# Patient Record
Sex: Female | Born: 1935 | Race: White | Hispanic: No | Marital: Married | Smoking: Former smoker
Health system: Southern US, Community
[De-identification: ages and names within clinical notes are randomized; demographics above are authoritative.]

## PROBLEM LIST (undated history)

## (undated) DIAGNOSIS — G459 Transient cerebral ischemic attack, unspecified: Secondary | ICD-10-CM

## (undated) DIAGNOSIS — F0390 Unspecified dementia without behavioral disturbance: Secondary | ICD-10-CM

## (undated) DIAGNOSIS — R0602 Shortness of breath: Secondary | ICD-10-CM

## (undated) DIAGNOSIS — J449 Chronic obstructive pulmonary disease, unspecified: Secondary | ICD-10-CM

## (undated) DIAGNOSIS — K219 Gastro-esophageal reflux disease without esophagitis: Secondary | ICD-10-CM

## (undated) DIAGNOSIS — I4892 Unspecified atrial flutter: Secondary | ICD-10-CM

## (undated) DIAGNOSIS — I10 Essential (primary) hypertension: Secondary | ICD-10-CM

## (undated) DIAGNOSIS — M1990 Unspecified osteoarthritis, unspecified site: Secondary | ICD-10-CM

## (undated) HISTORY — DX: Unspecified dementia, unspecified severity, without behavioral disturbance, psychotic disturbance, mood disturbance, and anxiety: F03.90

## (undated) HISTORY — DX: Essential (primary) hypertension: I10

## (undated) HISTORY — DX: Gastro-esophageal reflux disease without esophagitis: K21.9

## (undated) HISTORY — DX: Unspecified atrial flutter: I48.92

---

## 1998-08-24 ENCOUNTER — Other Ambulatory Visit: Admission: RE | Admit: 1998-08-24 | Discharge: 1998-08-24 | Payer: Self-pay | Admitting: Obstetrics and Gynecology

## 1998-11-03 ENCOUNTER — Other Ambulatory Visit: Admission: RE | Admit: 1998-11-03 | Discharge: 1998-11-03 | Payer: Self-pay | Admitting: Family Medicine

## 1999-03-07 HISTORY — PX: RADIOFREQUENCY ABLATION: SHX2290

## 1999-03-15 ENCOUNTER — Encounter: Admission: RE | Admit: 1999-03-15 | Discharge: 1999-03-15 | Payer: Self-pay | Admitting: Family Medicine

## 1999-03-15 ENCOUNTER — Encounter: Payer: Self-pay | Admitting: Family Medicine

## 1999-03-31 ENCOUNTER — Encounter: Payer: Self-pay | Admitting: Family Medicine

## 1999-03-31 ENCOUNTER — Encounter: Admission: RE | Admit: 1999-03-31 | Discharge: 1999-03-31 | Payer: Self-pay | Admitting: Family Medicine

## 1999-11-19 ENCOUNTER — Inpatient Hospital Stay (HOSPITAL_COMMUNITY): Admission: EM | Admit: 1999-11-19 | Discharge: 1999-11-20 | Payer: Self-pay | Admitting: Podiatry

## 1999-11-19 ENCOUNTER — Encounter: Payer: Self-pay | Admitting: Emergency Medicine

## 1999-11-23 ENCOUNTER — Ambulatory Visit (HOSPITAL_COMMUNITY): Admission: RE | Admit: 1999-11-23 | Discharge: 1999-11-24 | Payer: Self-pay | Admitting: Internal Medicine

## 2001-10-14 ENCOUNTER — Other Ambulatory Visit: Admission: RE | Admit: 2001-10-14 | Discharge: 2001-10-14 | Payer: Self-pay | Admitting: Family Medicine

## 2002-11-04 ENCOUNTER — Encounter: Admission: RE | Admit: 2002-11-04 | Discharge: 2002-11-04 | Payer: Self-pay | Admitting: Family Medicine

## 2002-11-04 ENCOUNTER — Encounter: Payer: Self-pay | Admitting: Family Medicine

## 2003-11-12 ENCOUNTER — Encounter: Admission: RE | Admit: 2003-11-12 | Discharge: 2003-11-12 | Payer: Self-pay | Admitting: Family Medicine

## 2004-01-25 ENCOUNTER — Encounter: Admission: RE | Admit: 2004-01-25 | Discharge: 2004-03-10 | Payer: Self-pay | Admitting: Family Medicine

## 2004-12-30 ENCOUNTER — Encounter: Admission: RE | Admit: 2004-12-30 | Discharge: 2004-12-30 | Payer: Self-pay | Admitting: Family Medicine

## 2006-02-08 ENCOUNTER — Encounter: Admission: RE | Admit: 2006-02-08 | Discharge: 2006-02-08 | Payer: Self-pay | Admitting: Internal Medicine

## 2006-12-25 ENCOUNTER — Ambulatory Visit: Payer: Self-pay | Admitting: Internal Medicine

## 2007-01-02 ENCOUNTER — Ambulatory Visit: Payer: Self-pay

## 2007-01-14 ENCOUNTER — Emergency Department (HOSPITAL_COMMUNITY): Admission: EM | Admit: 2007-01-14 | Discharge: 2007-01-14 | Payer: Self-pay | Admitting: Emergency Medicine

## 2007-03-25 ENCOUNTER — Emergency Department (HOSPITAL_COMMUNITY): Admission: EM | Admit: 2007-03-25 | Discharge: 2007-03-25 | Payer: Self-pay | Admitting: Emergency Medicine

## 2007-05-17 ENCOUNTER — Emergency Department (HOSPITAL_COMMUNITY): Admission: EM | Admit: 2007-05-17 | Discharge: 2007-05-17 | Payer: Self-pay | Admitting: Emergency Medicine

## 2009-01-19 ENCOUNTER — Ambulatory Visit: Payer: Self-pay | Admitting: Internal Medicine

## 2009-01-19 ENCOUNTER — Emergency Department (HOSPITAL_COMMUNITY): Admission: EM | Admit: 2009-01-19 | Discharge: 2009-01-19 | Payer: Self-pay | Admitting: Emergency Medicine

## 2009-02-12 DIAGNOSIS — I4892 Unspecified atrial flutter: Secondary | ICD-10-CM

## 2009-02-12 DIAGNOSIS — I10 Essential (primary) hypertension: Secondary | ICD-10-CM

## 2009-02-16 ENCOUNTER — Ambulatory Visit: Payer: Self-pay | Admitting: Internal Medicine

## 2009-02-16 DIAGNOSIS — I4891 Unspecified atrial fibrillation: Secondary | ICD-10-CM | POA: Insufficient documentation

## 2009-03-09 IMAGING — CR DG CHEST 1V PORT
1 series · 1 of 1 positions shown · non-contrast
Comparison: None.

CLINICAL DATA: 76-year-old with chest pain. 
PORTABLE CHEST - 1 VIEW:

[view not recorded]
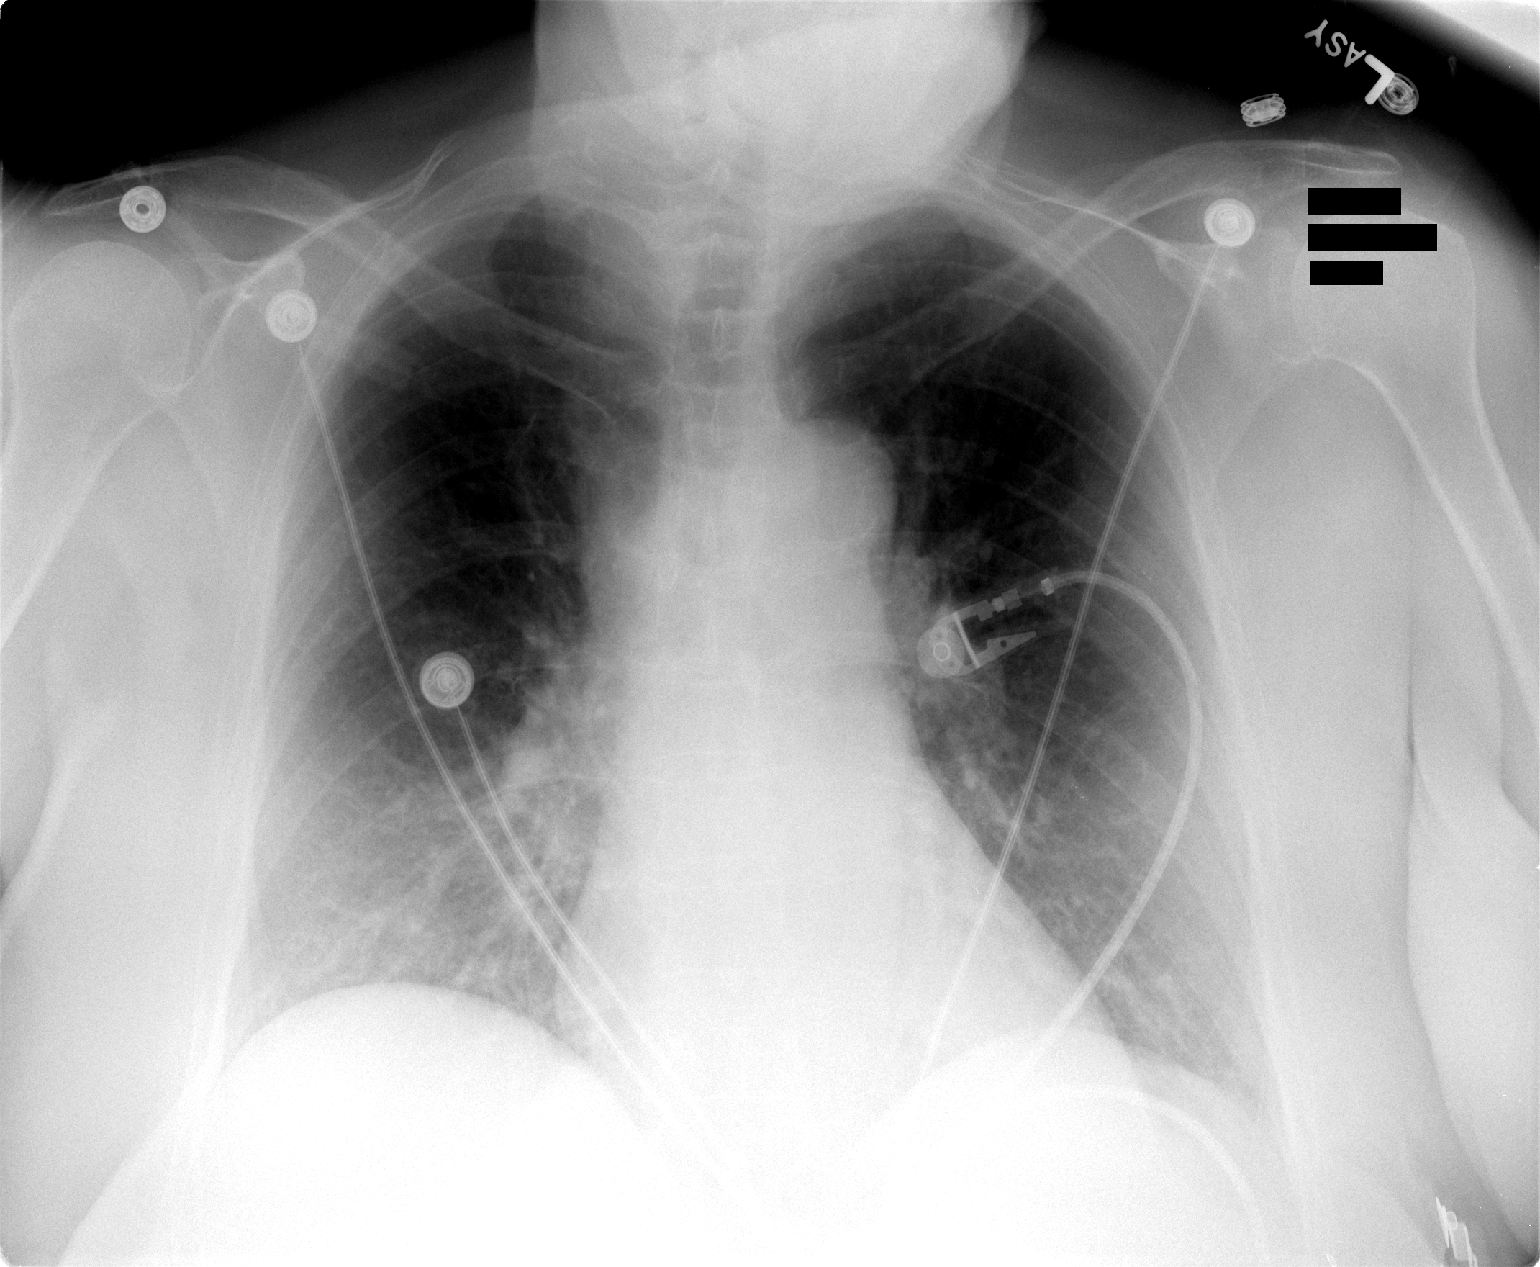

[1 of 1 positions shown; findings below may reference images not displayed]

FINDINGS: The cardiac silhouette, mediastinal, and hilar contours are within normal limits for the AP portable projection.  Right paratracheal density appears to be vascularity.  There is mild vascular crowding and streaky basilar atelectasis or scarring changes. No edema, infiltrates, or effusions.
IMPRESSION: No acute cardiopulmonary findings. Mild basilar vascular crowding and streaky atelectasis or scarring.

## 2009-04-13 ENCOUNTER — Emergency Department (HOSPITAL_COMMUNITY): Admission: EM | Admit: 2009-04-13 | Discharge: 2009-04-13 | Payer: Self-pay | Admitting: Emergency Medicine

## 2009-08-10 ENCOUNTER — Ambulatory Visit: Payer: Self-pay | Admitting: Internal Medicine

## 2009-09-16 ENCOUNTER — Emergency Department (HOSPITAL_BASED_OUTPATIENT_CLINIC_OR_DEPARTMENT_OTHER): Admission: EM | Admit: 2009-09-16 | Discharge: 2009-09-16 | Payer: Self-pay | Admitting: Emergency Medicine

## 2009-09-16 ENCOUNTER — Ambulatory Visit: Payer: Self-pay | Admitting: Diagnostic Radiology

## 2009-10-28 ENCOUNTER — Ambulatory Visit: Payer: Self-pay | Admitting: Internal Medicine

## 2010-04-05 NOTE — Assessment & Plan Note (Signed)
Summary: follow up new meds/mt   Visit Type:  Follow-up Primary Provider:  Leanord Hawking, MD   History of Present Illness: Hannah Bowers returns today for followup.  She is a pleasant 77 yo woman with a h/o dementia and atrial fibrillation.  She was started on amiodarone. With her dementia, she was not thought to be a good coumadin candidate.  Since starting amiodarone, she has not had any more symptomatic atrial fibrillation.  However she was hospitalized with a TIA several months ago.  She denies c/p or palpitations.  Her daughter states that she continues to be very pleasantly demented.  She fell one time, tripping on carpet.  Current Medications (verified): 1)  Amlodipine Besylate 5 Mg Tabs (Amlodipine Besylate) .... Take One Tablet By Mouth Daily 2)  Hydrochlorothiazide 25 Mg Tabs (Hydrochlorothiazide) .... Take One Tablet By Mouth Daily. 3)  Aspirin Ec 325 Mg Tbec (Aspirin) .... Take One Tablet By Mouth Daily 4)  Loratadine Allergy Relief 10 Mg Tbdp (Loratadine) .... Take One Tablet By Mouth Once Daily. 5)  Flonase 50 Mcg/act Susp (Fluticasone Propionate) .... Uad 6)  Galantamine Hydrobromide 24 Mg Xr24h-Cap (Galantamine Hydrobromide) .... Take One Tablet By Mouth Once Daily. 7)  Protonix 40 Mg Tbec (Pantoprazole Sodium) .... Take One Tablet By Mouth Once Daily. 8)  Alprazolam 0.5 Mg Tabs (Alprazolam) .Marland Kitchen.. 1 Tab Two Times A Day 9)  Amiodarone Hcl 200 Mg Tabs (Amiodarone Hcl) .... Take One Tablet By Mouth Once Daily 10)  Nitrostat 0.4 Mg Subl (Nitroglycerin) .Marland Kitchen.. 1 Tablet Under Tongue At Onset of Chest Pain; You May Repeat Every 5 Minutes For Up To 3 Doses. 11)  Mapap 325 Mg Tabs (Acetaminophen) .... Uad As Needed 12)  Potassium Chloride Cr 10 Meq Cr-Caps (Potassium Chloride) .... Take One Tablet By Mouth Daily 13)  Levothroid 25 Mcg Tabs (Levothyroxine Sodium) .... Take One Tablet By Mouth Once Daily. 14)  Pradaxa 150 Mg Caps (Dabigatran Etexilate Mesylate) .... Take 1 Capsule By Mouth Two Times A  Day  Allergies (verified): No Known Drug Allergies  Past History:  Past Medical History: Last updated: 02/16/2009 Current Problems:  HYPERTENSION (ICD-401.9) ATRIAL FLUTTER, PAROXYSMAL (ICD-427.32)    Past Surgical History: Last updated: 02/12/2009  radiofrequency catheter ablation 2001  Review of Systems  The patient denies chest pain, syncope, dyspnea on exertion, and peripheral edema.    Vital Signs:  Patient profile:   77 year old female Height:      66 inches Weight:      211 pounds Pulse rate:   61 / minute BP sitting:   138 / 80  (left arm)  Vitals Entered By: Laurance Flatten CMA (October 28, 2009 2:43 PM)  Physical Exam  General:  Elderly, well developed, well nourished, in no acute distress.  HEENT: normal Neck: supple. No JVD. Carotids 2+ bilaterally no bruits Cor: RRR no rubs, gallops or murmur Lungs: CTA Ab: soft, nontender. nondistended. No HSM. Good bowel sounds Ext: warm. no cyanosis, clubbing or edema Neuro: alert and oriented. Grossly nonfocal. affect pleasant    Impression & Recommendations:  Problem # 1:  ATRIAL FIBRILLATION (ICD-427.31) Her symptoms are well controlled. Continue meds as below. Her updated medication list for this problem includes:    Aspirin Ec 325 Mg Tbec (Aspirin) .Marland Kitchen... Take one tablet by mouth daily    Amiodarone Hcl 200 Mg Tabs (Amiodarone hcl) .Marland Kitchen... Take one tablet by mouth once daily  Problem # 2:  HYPERTENSION (ICD-401.9) Continue current meds. A low sodium diet is  recommended. Her updated medication list for this problem includes:    Amlodipine Besylate 5 Mg Tabs (Amlodipine besylate) .Marland Kitchen... Take one tablet by mouth daily    Hydrochlorothiazide 25 Mg Tabs (Hydrochlorothiazide) .Marland Kitchen... Take one tablet by mouth daily.    Aspirin Ec 325 Mg Tbec (Aspirin) .Marland Kitchen... Take one tablet by mouth daily  Patient Instructions: 1)  Your physician recommends that you schedule a follow-up appointment in: 6 months with Dr Ladona Ridgel

## 2010-04-05 NOTE — Assessment & Plan Note (Signed)
Summary: 6 MO F/U   Visit Type:  Follow-up Primary Provider:  Leanord Hawking, MD   History of Present Illness: Hannah Bowers returns today for followup.  She is a pleasant 77 yo woman with a h/o dementia and atrial fibrillation.  She was started on amiodarone. With her dementia, she was not thought to be a good coumadin candidate.  Since starting amiodarone, she has not had any more symptomatic atrial fibrillation.  However she was hospitalized with a TIA several months ago.  She denies c/p or palpitations.  Current Medications (verified): 1)  Amlodipine Besylate 5 Mg Tabs (Amlodipine Besylate) .... Take One Tablet By Mouth Daily 2)  Hydrochlorothiazide 25 Mg Tabs (Hydrochlorothiazide) .... Take One Tablet By Mouth Daily. 3)  Aspirin Ec 325 Mg Tbec (Aspirin) .... Take One Tablet By Mouth Daily 4)  Loratadine Allergy Relief 10 Mg Tbdp (Loratadine) .... Take One Tablet By Mouth Once Daily. 5)  Flonase 50 Mcg/act Susp (Fluticasone Propionate) .... Uad 6)  Galantamine Hydrobromide 24 Mg Xr24h-Cap (Galantamine Hydrobromide) .... Take One Tablet By Mouth Once Daily. 7)  Protonix 40 Mg Tbec (Pantoprazole Sodium) .... Take One Tablet By Mouth Once Daily. 8)  Alprazolam 0.5 Mg Tabs (Alprazolam) .Marland Kitchen.. 1 Tab Two Times A Day 9)  Amiodarone Hcl 200 Mg Tabs (Amiodarone Hcl) .... Take One Tablet By Mouth Once Daily 10)  Nitrostat 0.4 Mg Subl (Nitroglycerin) .Marland Kitchen.. 1 Tablet Under Tongue At Onset of Chest Pain; You May Repeat Every 5 Minutes For Up To 3 Doses. 11)  Mapap 325 Mg Tabs (Acetaminophen) .... Uad As Needed 12)  Potassium Chloride Cr 10 Meq Cr-Caps (Potassium Chloride) .... Take One Tablet By Mouth Daily 13)  Levothroid 25 Mcg Tabs (Levothyroxine Sodium) .... Take One Tablet By Mouth Once Daily.  Allergies (verified): No Known Drug Allergies  Past History:  Past Medical History: Last updated: 02/16/2009 Current Problems:  HYPERTENSION (ICD-401.9) ATRIAL FLUTTER, PAROXYSMAL (ICD-427.32)    Past  Surgical History: Last updated: 02/12/2009  radiofrequency catheter ablation 2001  Review of Systems  The patient denies chest pain, syncope, dyspnea on exertion, and peripheral edema.    Vital Signs:  Patient profile:   77 year old female Height:      66 inches Weight:      208 pounds BMI:     33.69 Pulse rate:   61 / minute BP sitting:   146 / 60  (left arm)  Vitals Entered By: Laurance Flatten CMA (August 10, 2009 3:20 PM)  Physical Exam  General:  Elderly, well developed, well nourished, in no acute distress.  HEENT: normal Neck: supple. No JVD. Carotids 2+ bilaterally no bruits Cor: RRR no rubs, gallops or murmur Lungs: CTA Ab: soft, nontender. nondistended. No HSM. Good bowel sounds Ext: warm. no cyanosis, clubbing or edema Neuro: alert and oriented. Grossly nonfocal. affect pleasant    EKG  Procedure date:  08/10/2009  Findings:      Normal sinus rhythm with rate of:  61. IRBB  Impression & Recommendations:  Problem # 1:  ATRIAL FIBRILLATION (ICD-427.31) With the patients TIA and other cardiac risk factors, she is at high risk for stroke.  While she has some dementia, she has not fallen and is able to take her meds fairly reliably.  I have recommended we start Pradaxa for thromboembolic prevention. Her updated medication list for this problem includes:    Aspirin Ec 325 Mg Tbec (Aspirin) .Marland Kitchen... Take one tablet by mouth daily    Amiodarone Hcl 200 Mg  Tabs (Amiodarone hcl) .Marland Kitchen... Take one tablet by mouth once daily  Problem # 2:  HYPERTENSION (ICD-401.9) She has reasonable control.  A low sodium diet is requested. Her updated medication list for this problem includes:    Amlodipine Besylate 5 Mg Tabs (Amlodipine besylate) .Marland Kitchen... Take one tablet by mouth daily    Hydrochlorothiazide 25 Mg Tabs (Hydrochlorothiazide) .Marland Kitchen... Take one tablet by mouth daily.    Aspirin Ec 325 Mg Tbec (Aspirin) .Marland Kitchen... Take one tablet by mouth daily  Patient Instructions: 1)  Your physician  has recommended you make the following change in your medication: Start Pradaxa 150mg  1 capsule twice daily.

## 2010-04-07 NOTE — Miscellaneous (Signed)
Summary: [facility] Office Visit Note   [facility] Office Visit Note   Imported By: Roderic Ovens 02/24/2010 16:04:01  _____________________________________________________________________  External Attachment:    Type:   Image     Comment:   External Document

## 2010-04-28 ENCOUNTER — Ambulatory Visit (INDEPENDENT_AMBULATORY_CARE_PROVIDER_SITE_OTHER): Payer: MEDICARE | Admitting: Internal Medicine

## 2010-04-28 ENCOUNTER — Encounter: Payer: Self-pay | Admitting: Internal Medicine

## 2010-04-28 DIAGNOSIS — I4891 Unspecified atrial fibrillation: Secondary | ICD-10-CM

## 2010-04-28 DIAGNOSIS — I10 Essential (primary) hypertension: Secondary | ICD-10-CM

## 2010-05-03 NOTE — Assessment & Plan Note (Signed)
Summary: follow up/klrov/sl   Primary Provider:  Leanord Hawking, MD   History of Present Illness: Hannah Bowers returns today for followup.  She is a pleasant 77 yo woman with a h/o dementia and atrial fibrillation.  She was started on amiodarone. With her dementia, she was not thought to be a good coumadin candidate.  Since starting amiodarone, she has not had any more symptomatic atrial fibrillation.  She has a remote TIA.   She denies c/p or palpitations.  Her daughter states that she continues to be very pleasantly demented.  She has not had any recent falls.  Medications Prior to Update: 1)  Amlodipine Besylate 5 Mg Tabs (Amlodipine Besylate) .... Take One Tablet By Mouth Daily 2)  Hydrochlorothiazide 25 Mg Tabs (Hydrochlorothiazide) .... Take One Tablet By Mouth Daily. 3)  Aspirin Ec 325 Mg Tbec (Aspirin) .... Take One Tablet By Mouth Daily 4)  Loratadine Allergy Relief 10 Mg Tbdp (Loratadine) .... Take One Tablet By Mouth Once Daily. 5)  Flonase 50 Mcg/act Susp (Fluticasone Propionate) .... Uad 6)  Galantamine Hydrobromide 24 Mg Xr24h-Cap (Galantamine Hydrobromide) .... Take One Tablet By Mouth Once Daily. 7)  Protonix 40 Mg Tbec (Pantoprazole Sodium) .... Take One Tablet By Mouth Once Daily. 8)  Alprazolam 0.5 Mg Tabs (Alprazolam) .Marland Kitchen.. 1 Tab Two Times A Day 9)  Amiodarone Hcl 200 Mg Tabs (Amiodarone Hcl) .... Take One Tablet By Mouth Once Daily 10)  Nitrostat 0.4 Mg Subl (Nitroglycerin) .Marland Kitchen.. 1 Tablet Under Tongue At Onset of Chest Pain; You May Repeat Every 5 Minutes For Up To 3 Doses. 11)  Mapap 325 Mg Tabs (Acetaminophen) .... Uad As Needed 12)  Potassium Chloride Cr 10 Meq Cr-Caps (Potassium Chloride) .... Take One Tablet By Mouth Daily 13)  Levothroid 25 Mcg Tabs (Levothyroxine Sodium) .... Take One Tablet By Mouth Once Daily. 14)  Pradaxa 150 Mg Caps (Dabigatran Etexilate Mesylate) .... Take 1 Capsule By Mouth Two Times A Day  Allergies (verified): No Known Drug Allergies  Past  History:  Past Medical History: Last updated: 02/16/2009 Current Problems:  HYPERTENSION (ICD-401.9) ATRIAL FLUTTER, PAROXYSMAL (ICD-427.32)    Past Surgical History: Last updated: 02/12/2009  radiofrequency catheter ablation 2001  Review of Systems  The patient denies chest pain, syncope, dyspnea on exertion, and peripheral edema.    Vital Signs:  Patient profile:   77 year old female Height:      66 inches Weight:      207 pounds BMI:     33.53 Pulse rate:   63 / minute BP sitting:   118 / 70  (left arm)  Vitals Entered By: Hannah Bowers CMA (April 28, 2010 4:16 PM)  Physical Exam  General:  Elderly, well developed, well nourished, in no acute distress.  HEENT: normal Neck: supple. No JVD. Carotids 2+ bilaterally no bruits Cor: RRR no rubs, gallops or murmur Lungs: CTA Ab: soft, nontender. nondistended. No HSM. Good bowel sounds Ext: warm. no cyanosis, clubbing or edema Neuro: alert and oriented. Grossly nonfocal. affect pleasant    Impression & Recommendations:  Problem # 1:  ATRIAL FIBRILLATION (ICD-427.31) She appears to be maintaining NSR on amiodarone. Continue her current meds and will hold on coumadin with a h/o falls and dementia. Her updated medication list for this problem includes:    Aspirin Ec 325 Mg Tbec (Aspirin) .Marland Kitchen... Take one tablet by mouth daily    Amiodarone Hcl 200 Mg Tabs (Amiodarone hcl) .Marland Kitchen... Take one tablet by mouth once daily  Problem #  2:  HYPERTENSION (ICD-401.9) Her blood pressure is well controlled. Continue current meds Her updated medication list for this problem includes:    Amlodipine Besylate 5 Mg Tabs (Amlodipine besylate) .Marland Kitchen... Take one tablet by mouth daily    Hydrochlorothiazide 25 Mg Tabs (Hydrochlorothiazide) .Marland Kitchen... Take one tablet by mouth daily.    Aspirin Ec 325 Mg Tbec (Aspirin) .Marland Kitchen... Take one tablet by mouth daily  Patient Instructions: 1)  Your physician wants you to follow-up in:  12 months with Dr Court Joy  will receive a reminder letter in the mail two months in advance. If you don't receive a letter, please call our office to schedule the follow-up appointment. 2)  Your physician recommends that you continue on your current medications as directed. Please refer to the Current Medication list given to you today.

## 2010-05-25 LAB — COMPREHENSIVE METABOLIC PANEL
AST: 53 U/L — ABNORMAL HIGH (ref 0–37)
Alkaline Phosphatase: 63 U/L (ref 39–117)
CO2: 26 mEq/L (ref 19–32)
Calcium: 8.9 mg/dL (ref 8.4–10.5)
Creatinine, Ser: 0.86 mg/dL (ref 0.4–1.2)
GFR calc non Af Amer: >60 mL/min (ref >60)
Glucose, Bld: 123 mg/dL — ABNORMAL HIGH (ref 70–99)
Total Protein: 7.6 g/dL (ref 6.0–8.3)

## 2010-05-25 LAB — URINALYSIS, ROUTINE W REFLEX MICROSCOPIC
Bilirubin Urine: NEGATIVE
Glucose, UA: NEGATIVE mg/dL
Nitrite: NEGATIVE
Specific Gravity, Urine: 1.016 (ref 1.005–1.030)
Urobilinogen, UA: 1 mg/dL (ref 0.0–1.0)
pH: 7.5 (ref 5.0–8.0)

## 2010-05-25 LAB — POCT CARDIAC MARKERS
CKMB, poc: 6.6 ng/mL (ref 1.0–8.0)
Myoglobin, poc: 155 ng/mL (ref 12–200)
Troponin i, poc: <0.05 ng/mL (ref 0.00–0.09)

## 2010-05-25 LAB — DIFFERENTIAL
Eosinophils Absolute: 0 10*3/uL (ref 0.0–0.7)
Lymphocytes Relative: 20 % (ref 12–46)
Monocytes Absolute: 0.5 10*3/uL (ref 0.1–1.0)
Monocytes Relative: 8 % (ref 3–12)

## 2010-05-25 LAB — CBC
Hemoglobin: 14.4 g/dL (ref 12.0–15.0)
MCV: 91.6 fL (ref 78.0–100.0)
Platelets: 182 10*3/uL (ref 150–400)
RBC: 4.56 MIL/uL (ref 3.87–5.11)

## 2010-06-08 LAB — CBC
Hemoglobin: 13.8 g/dL (ref 12.0–15.0)
RDW: 14.1 % (ref 11.5–15.5)
WBC: 7.8 10*3/uL (ref 4.0–10.5)

## 2010-06-08 LAB — DIFFERENTIAL
Basophils Relative: 1 % (ref 0–1)
Eosinophils Absolute: 0.2 10*3/uL (ref 0.0–0.7)
Eosinophils Relative: 2 % (ref 0–5)
Lymphs Abs: 2.9 10*3/uL (ref 0.7–4.0)
Monocytes Absolute: 0.6 10*3/uL (ref 0.1–1.0)
Neutro Abs: 4.1 10*3/uL (ref 1.7–7.7)
Neutrophils Relative %: 52 % (ref 43–77)

## 2010-06-08 LAB — BASIC METABOLIC PANEL
Calcium: 9.2 mg/dL (ref 8.4–10.5)
Creatinine, Ser: 0.96 mg/dL (ref 0.4–1.2)
GFR calc non Af Amer: 57 mL/min — ABNORMAL LOW (ref >60)
Glucose, Bld: 114 mg/dL — ABNORMAL HIGH (ref 70–99)

## 2010-06-08 LAB — POCT CARDIAC MARKERS
CKMB, poc: 3.9 ng/mL (ref 1.0–8.0)
Troponin i, poc: <0.05 ng/mL (ref 0.00–0.09)

## 2010-06-08 LAB — TROPONIN I: Troponin I: 0.02 ng/mL (ref 0.00–0.06)

## 2010-06-08 LAB — CK TOTAL AND CKMB (NOT AT ARMC): Total CK: 169 U/L (ref 7–177)

## 2010-07-19 NOTE — Assessment & Plan Note (Signed)
[facility]                         ELECTROPHYSIOLOGY OFFICE NOTE   NAME:Hannah Bowers, Hannah Bowers                        MRN:          [id number]  DATE:12/25/2006                            DOB:          12-May-1930    Hannah Bowers is referred today, after a long absence from our EP clinic,  for evaluation of shortness of breath and atypical chest pain.  The  patient also has palpitations.  Her history dates back over seven years  ago, when I first met her and she was having symptomatic SVT, which was  subsequently found to be 1:1 atrial flutter.  She underwent  electrophysiologic study and catheter ablation of her atrial flutter and  has done well for many years.   At that time, she was diagnosed with some dementia, as well.  She has  recently been widowed.  She notes that, over the last several weeks, she  would have occasional episodes of palpitations, as well as substernal  chest pain, with the symptoms not being related temporally.   She is now referred for additional evaluation.   MEDICATIONS INCLUDE:  1. Amlodipine 5 mg a day.  2. Steroid inhaler.  3. HCTZ 25 a day.  4. Pantoprazole 40 a day.  5. Alprazolam.  6. Lexapro.   SOCIAL HISTORY:  The patient now resides in an assisted living center.  She is widowed.  She denies tobacco abuse, stopping smoking over 30  years ago.  She rarely drinks alcoholic beverages.   FAMILY HISTORY:  Notable for a father with Alzheimer's dementia and a  mother with questionable MS.   REVIEW OF SYSTEMS:  Notable for some problems with dementia and some  bizarre behaviors.  Otherwise, all systems were reviewed and found to be  negative.  She does have some arthritis.   PHYSICAL EXAM:  She is a pleasant, elderly woman, in no distress.  Blood pressure was 141/69, the pulse was 62 and regular, respirations  were 18, the weight was 188 pounds.  HEENT EXAM:  Normocephalic, atraumatic.  Pupils equal and round.  The  oropharynx is moist.  The sclerae are anicteric.  NECK:  Revealed no jugular venous distention.  There was no thyromegaly.  The trachea was midline.  The carotids were 2+ and symmetric.  LUNGS:  Clear bilaterally to auscultation, no wheezes, rales or rhonchi  were present.  There was no increased work of breathing.  CARDIOVASCULAR EXAM:  Revealed a regular rate and rhythm, normal S1 and  S2.  EXTREMITIES:  Demonstrated no cyanosis, clubbing or edema.  The pulses  were 2+ and symmetric.  NEUROLOGIC EXAM:  Alert and oriented times three with cranial nerves  intact.  Strength was 5/5 and symmetric.   The EKG demonstrated sinus bradycardia with a short PR interval.   IMPRESSION:  1. Atypical chest pain.  2. History of atrial flutter, status post ablation.  3. Rare palpitations.   DISCUSSION:  I have recommended the patient undergo exercise treadmill  testing and, if this is positive, catheterization; if negative, then  three-week cardiac monitor would be recommended.  We will see  her back  following these studies.     Doylene Canning. Ladona Ridgel, MD  Electronically Signed    GWT/MedQ  DD: 12/25/2006  DT: 12/26/2006  Job #: [id number]   cc:   Maxwell Caul, M.D.

## 2010-07-22 NOTE — Op Note (Signed)
[place]. [facility]  Patient:    Hannah Bowers, Hannah Bowers                        MRN: [id number] Proc. Date: 11/23/99 Adm. Date:  [id number] Disc. Date: [id number] Attending:  Lewayne Bunting CC:         [place]ale [place]. Andrey Campanile, M.D.             Kathrine Cords, R.N.,  Clinic                           Operative Report  PROCEDURE:  Electrophysiologic study and radiofrequency catheter ablation of atrial flutter.  CARDIOLOGIST:  Doylene Canning. Ladona Ridgel, M.D. [facility]  INDICATIONS:  Symptomatic wide complex QRS tachycardia of unknown etiology with questionable suggestion of an accessory pathway.  I.  INTRODUCTION:     The patient is a very pleasant 77 year old woman who presented to the     hospital back on September 15 with history of palpitations.  She was found     to have wide complex QRS tachycardia at the rate of 260 beats per minute.     She was treated with lidocaine which did not improve her symptoms.  She     was given adenosine which slowed her rate and resulted in atrial     fibrillation.  Her baseline ECG had a short PR interval and suggested     partial preexcitation.  Because of the concern for potential dangerous     arrhythmia associated with tachycardia, she was referred for     electrophysiology study and RF catheter ablation.  II. PROCEDURE:     After informed consent was obtained, the patient was taken to the     diagnostic EP lab in the fasting state.  After the usual preparation and     draping, intravenous fentanyl and midazolam were given for conscious     sedation.      A 6-French hexapolar catheter was inserted percutaneously in the right     jugular vein and advanced to the coronary sinus.  A 6-French quadripole     catheter was inserted percutaneously in the right femoral vein and     advanced to the RV apex.  A 5-French quadripolar catheter was inserted     percutaneously into the right femoral vein and advanced to the His bundle     region.  A 5-French  quadripolar catheter was inserted percutaneously in     the right femoral vein and advanced to the right atrium.  After     measurement of the basic intervals, rapid ventricular pacing was carried     out from the right ventricle and stepwise decreased down to 370 msec where     V-A Wenckebach was observed.  During rapid ventricular pacing, the atrial     activation sequence was midline and decremental.      Next, programmed ventricular stimulation was carried out from the RV     apex and stepwise decreased down to 230 msec where the ERP of the     ventricle was observed.  During programmed ventricular stimulation, the     atrial activation sequence was once again midline and decremental.      Next, rapid atrial pacing was carried out from the coronary sinus and     stepwise decreased down to 270 msec where A-V Wenckebach was observed.     Programmed atrial  stimulation was then carried out from the high right     atrium at a basic drive cycle length of 621 msec.  The S1 and S2 interval     was stepwise decreased down to 240 msec where there was jump in an echo     beat noticed.  Additional decrements in the S1, S2 interval down to     220 msec resulted in the initiation of atrial flutter.  At this point,     the halo catheter was placed in the right atrium, and mapping of the ]     patients atrial flutter demonstrated typical counterclockwise flutter.     The cycle length of the flutter was 214 msec.  At this point, the ablation     catheter was maneuvered into the tricuspid valve isthmus.  At total of     three RF energy applications were subsequently delivered, and during the     third RF energy application, sinus rhythm was restored.  The patient had     intermittent nonsustained atrial fibrillation as well as atrial flutter     during this period of time.  Two additional RF energy applications were     then delivered, and the patient developed bidirectional block in the     atrial  flutter isthmus.  Following this, four bonus RF energy applications     were delivered, and the patient was observed for 40 minutes and had no     additional isthmus conduction.  Programmed atrial stimulation as well as     rapid atrial pacing were then carried out, and there was no evidence of     recurrent isthmus conduction, and there was no evidence of inducible SVT.      Finally, because of concern over possible accessory pathway, additional     rapid ventricular pacing and programmed ventricular stimulation was     carried out, and there was no evidence of accessory pathway conduction or     in the accessory pathway.  The patient was observed for a total of     40 minutes after the creation of bidirectional block, and additional     pacing was carried out demonstrating no additional isthmus conduction.     The catheter was then removed.  Hemostasis was assured, and the patient     returned to her room in good condition.  III.COMPLICATIONS:  None.  IV. RESULTS:     A. Baseline 12- lead ECG.  The baseline 12-lead ECG demonstrates normal        sinus rhythm with a PR interval of 120 msec.     B. Baseline intervals.  The sinus node cycle length was 1000 at 30 msec.        The A-H interval was 54 msec.  The H-V interval was 49 msec.     C. Rapid atrial pacing.  Rapid atrial pacing was from the RV apex        demonstrated a V-A Wenckebach cycle length 370 msec.        Ventricular pacing.  The atrial activation was midline and decremental.     D. Programmed ventricular stimulation.  Programmed ventricular stimulation        was carried out from the RV apex at a base drive cycle length of        500 msec.  The S1 and S2 intervals stepwise decreased down to 220 msec        where the ERP of  the ventricle was observed.  During programmed        ventricular stimulation, the atrial activation sequence was once again        midline and decremental.     E. Rapid atrial pacing.  Rapid atrial  pacing was carried out from the        high right atrium and stepwise decreased down to 280 msec where A-V        Wenckebach was observed.  During rapid atrial pacing, the PR interval         remained less than the R-R interval, and there was no inducible SVT.     F. Programmed atrial stimulation.  Programmed atrial stimulation was        carried out from the coronary sinus at a base drive cycle length of        500 msec.  The S1, S2 interval was stepwise decreased down to        220 msec.  Prior to this, at an S1, S2 interval of 240 msec, there was        marked A-H jump at echo beats noted but no inducible SVT.     G. Arrhythmias observed.        1. Atrial flutter initiation.  Programmed atrial stimulation from the           high right atrium. Duration was sustained.  Termination was with           catheter ablation.  Cycle length was 214 msec.  It should be noted           that there was only 2:1 A-V conduction during atrial flutter.        2. Atrial fibrillation.  Initiation spontaneous.  Termination           spontaneous.  Duration nonsustained.     H. Mapping.  Mapping of the patients atrial flutter demonstrated typical        counterclockwise tricuspid annular reentry.     I. RF energy application.  A total of 3 RF energy applications were        initially delivered to the atrial flutter isthmus with termination of        atrial flutter and restoration of sinus rhythm.  Following this, two        additional RF energy applications were utilized which resulted in the        creation of bidirectional block.  Following this, a total of four bonus        RF energy applications were delivered.  V.  CONCLUSION:     This study demonstrates inducible atrial flutter.  There was no evidence     of an accessory pathway noted, and the patients atrial flutter could not     be conducted in the P Lab under sedation in a 1:1 fashion.  In fact, the     atrial flutter was typically 2:1 or 3:1.  The  patient underwent successful     catheter ablation of her atrial flutter with restoration of sinus rhythm     and creation of bidirectional block in the atrial flutter isthmus.  There     were no immediate procedure complications. DD:  11/23/99 TD:  11/25/99 Job: [id number]

## 2010-07-22 NOTE — Discharge Summary (Signed)
[place]. [facility]  Patient:    MAHLANI, BERNINGER                        MRN: [id number] Adm. Date:  [id number] Disc. Date: [id number] Attending:  Lewayne Bunting Dictator:   Lavella Hammock, P.A.-C. CC:         [place]. Andrey Campanile, M.D.                  Referring Physician Discharge [place]  DATE OF BIRTH:  12/19/1930  ______  HOSPITAL COURSE:  Mrs. Burright is 77 year old female who was evaluated for wide complex tachycardia in the office on November 21, 1999.  She had been admitted to the hospital on September 15 to November 20, 1999 for palpitations that were associated with hypertension and an increased heart rate.  She was thought to be in ventricular tachycardia and she was found by EMS to be in a wide complex tachycardia, rate of approximately 260.  However, lidocaine did not change the rhythm at all and adenosine was used, which showed underlying atrial fibrillation.  In the emergency room, she was given IV amiodarone and her heart rate revealed atrial flutter.  She converted to sinus rhythm and was at first in a junctional bradycardia but was then in sinus rhythm, and was scheduled for an outpatient evaluation.  She was admitted on November 23, 1999 for an ablation and ______ and typical atrial flutter ______ AV nodal physiology present with no inducible ______  Post procedure, her sites were stable and she was having no problems.  She was maintaining sinus rhythm.  She was considered stable for discharge on November 24, 1999 with Lovenox to bridge her to Coumadin.  DISCHARGE CONDITION:  Stable.  CONSULTS:  None.  COMPLICATIONS:  None.  DISCHARGE DIAGNOSES: ______ History of acne rosacea. ______  DISCHARGE INSTRUCTIONS:  ACTIVITY:  She is to gradually increase her activity level.  DIET:  She is to stick to a low fat diet.  WOUND CARE:  She is to call the office with any problems with the site.  FOLLOW-UP:  She is to get an INR checked  on Monday at 2 p.m. in the Coumadin clinic.  She is to follow up with Dr. Ladona Ridgel on October 19 at 11:30 a.m.  She is to follow up with Dr. Andrey Campanile as needed.  DISCHARGE MEDICATIONS: 1. Lovenox tomorrow only. 2. Coumadin 5 mg q.d. for three days, then 2.5 mg q.d. 3. Toprol XL 50 mg q.d. 4. Methacycline 100 mg q.d. 5. Aricept q.d. 6. Keflex 500 mg b.i.d. until the prescription is completed. 7. Multivitamin, vitamin C, ______ , and zinc caps as prior to admission. DD:  11/24/99 TD:  11/24/99 Job: [id number]

## 2010-07-22 NOTE — Discharge Summary (Signed)
[place]. [facility]  Patient:    KESHONNA, VALVO                        MRN: [id number] Adm. Date:  [id number] Disc. Date: [id number] Attending:  Daisey Must Dictator:   Joellyn Rued, P.A.C.                  Referring Physician Discharge [place]  Additional records were found and these show that Ms. Tolley cardioverted after receiving IV and p.o. Lopressor in the emergency room.  On conversion, she had a short period of sinus arrest and junctional bradycardia and her IV Cardizem was discontinued.  It is also noted that just prior to discharge, Dr. Lewayne Bunting spoke with Dr. Nathen May via the phone and it was felt that Ms. Marksberry should consider ablation therapy.  Dr. Graciela Husbands recommended that she be discharged home on Coumadin instead of aspirin; thus, she received a new prescription for Coumadin 5 mg.  She was instructed to take 5 mg p.o. between 4 and 6 p.m. for the next three days, then begin 2.5 mg.  She will have a PT/INR checked on Thursday in the Coumadin clinic.  She was again asked to call on Monday morning to arrange a followup echocardiogram and stress Cardiolite this week, with a followup appointment with Dr. Graciela Husbands for next week. DD:  11/20/99 TD:  11/21/99 Job: [id number]

## 2010-07-22 NOTE — Discharge Summary (Signed)
[place] Bow[place]. [place] [facility]  Patient:    Hannah Bowers, Hannah Bowers                        MRN: [id number] Adm. Date:  [id number] Disc. Date: 11/20/99 Attending:  Daisey Must Dictator:   Joellyn Rued, P.A.C. CC:         Stanley C. Andrey Campanile, M.D.  Daisey Must, M.D. [facility]   Referring Physician Discharge [place]ma  DATE OF BIRTH:  05/27/1930  HISTORY OF PRESENT ILLNESS:  Hannah Bowers is a 77 year old [place] female who presented to the emergency room with palpitations.  She describes a history of palpitations for over the last year that might have occurred two to three times every few months without any associated symptoms or loss of consciousness.  Around 2:30 p.m. on the day of presentation, she again noticed palpitations along with a chest tightness associated with shortness of breath. She checked her blood pressure and heart rate and noted it to be elevated. She called EMS and was found to have a tachycardia of approximately 260 and was felt to be wide complex.  They gave her lidocaine and this did not show any changes.  They also administered adenosine which showed transient flow with underlying atrial fibrillation.  On presentation to the emergency room, she was given IV amiodarone and her heart rate decreased to 130 with flutter waves.  We were called for evaluation.  At that point, she was feeling fine and not having any problems.  She does have a history of remote tobacco use.  LABORATORY DATA:  Hemoglobin 13.5, hematocrit 40.9, normal indices, platelets 174, WBC 6.2.  PT 12.7, PTT 27.  Sodium 145, potassium 3.6, BUN 16, creatinine 0.9, glucose 129.  LFTs within normal limits.  A series of three CK isoenzymes and troponins were negative for myocardial infarction.  The TSH was 4.263 with a free T4 of 1.13.  Fasting lipids are pending.  The EKG on admission showed wide complex tachycardia with a ventricular rate of 258, felt to be type 1 atrial fibrillation (1:1 ratio).   Subsequent EKG when showed to a ventricular rate showed atrial flutter and nonspecific ST-T wave changes.  The EKG on the morning of November 20, 1999, showed sinus bradycardia, otherwise within normal limits.  HOSPITAL COURSE:  [name] was admitted to the unit 3300.  By the time she arrived on the unit, she was normal sinus rhythm.  Enzymes and EKGs were negative for myocardial infarction and after reviewing with Lewayne Bunting, M.D., it was felt that she could be discharged home.  It was noted that with conversion to normal sinus rhythm, her IV Cardizem had been discontinued.  It was also noted that in the emergency room prior to her arrival to the floor she received 5 mg of IV Lopressor and 50 mg p.o. Lopressor.  DISPOSITION:  On November 20, 1999, she was discharged home.  DISCHARGE DIAGNOSES: Type 1 atrial flutter.  DISCHARGE MEDICATIONS:  Asked to take aspirin 325 mg q.d. and Toprol XL 50 mg q.d.  SPECIAL INSTRUCTIONS:  She was advised no caffeine and to avoid pseudoephedrine products.  FOLLOW-UP:  On Monday, she was asked to call our office to arrange an echocardiogram.  Follow-up appointment with Lewayne Bunting, M.D., or Daisey Must, M.D. DD:  11/20/99 TD:  11/20/99 Job: [id number]

## 2010-10-26 ENCOUNTER — Emergency Department (HOSPITAL_COMMUNITY)
Admission: EM | Admit: 2010-10-26 | Discharge: 2010-10-26 | Disposition: A | Discharge status: Home / Self Care | Payer: Medicare Other | Attending: Family Medicine | Admitting: Family Medicine

## 2010-10-26 DIAGNOSIS — Z79899 Other long term (current) drug therapy: Secondary | ICD-10-CM | POA: Insufficient documentation

## 2010-10-26 DIAGNOSIS — F0280 Dementia in other diseases classified elsewhere without behavioral disturbance: Secondary | ICD-10-CM | POA: Insufficient documentation

## 2010-10-26 DIAGNOSIS — G309 Alzheimer's disease, unspecified: Secondary | ICD-10-CM | POA: Insufficient documentation

## 2010-10-26 DIAGNOSIS — K219 Gastro-esophageal reflux disease without esophagitis: Secondary | ICD-10-CM | POA: Insufficient documentation

## 2010-10-26 DIAGNOSIS — I4891 Unspecified atrial fibrillation: Secondary | ICD-10-CM | POA: Insufficient documentation

## 2010-10-26 DIAGNOSIS — I10 Essential (primary) hypertension: Secondary | ICD-10-CM | POA: Insufficient documentation

## 2010-10-26 DIAGNOSIS — R079 Chest pain, unspecified: Secondary | ICD-10-CM | POA: Insufficient documentation

## 2010-10-26 LAB — POCT I-STAT TROPONIN I: Troponin i, poc: 0 ng/mL (ref 0.00–0.08)

## 2010-10-26 LAB — POCT I-STAT, CHEM 8
Chloride: 107 mEq/L (ref 96–112)
Glucose, Bld: 109 mg/dL — ABNORMAL HIGH (ref 70–99)
HCT: 40 % (ref 36.0–46.0)
Sodium: 143 mEq/L (ref 135–145)

## 2010-10-27 ENCOUNTER — Telehealth: Payer: Self-pay | Admitting: Internal Medicine

## 2010-10-27 NOTE — Telephone Encounter (Signed)
Pt was in [place] last night with elevated b/p and daughter wants pt to see Dr. Ladona Ridgel next week and she did not want to schedule with PA

## 2010-10-28 NOTE — Telephone Encounter (Signed)
They are checking her BP's at [facility] and  will let us know if a problems arises Hannah Bowers will call and get pt scheduled for next available with Dr Ladona Ridgel We did offer an appointment with the PA and the daughter did not want this  Also can go to her primary care if needed for BP follow up

## 2010-10-28 NOTE — Telephone Encounter (Signed)
Pt's dtr calling back re appt, pls call [phone] cindy

## 2010-11-21 ENCOUNTER — Encounter: Payer: Self-pay | Admitting: Internal Medicine

## 2010-11-22 ENCOUNTER — Ambulatory Visit (INDEPENDENT_AMBULATORY_CARE_PROVIDER_SITE_OTHER): Payer: Medicare Other | Admitting: Internal Medicine

## 2010-11-22 DIAGNOSIS — I10 Essential (primary) hypertension: Secondary | ICD-10-CM

## 2010-11-22 DIAGNOSIS — R079 Chest pain, unspecified: Secondary | ICD-10-CM

## 2010-11-22 NOTE — Patient Instructions (Signed)
Your physician wants you to follow-up in: 6 months with Dr Taylor You will receive a reminder letter in the mail two months in advance. If you don't receive a letter, please call our office to schedule the follow-up appointment.  

## 2010-11-22 NOTE — Assessment & Plan Note (Signed)
[facility]                        ELECTROPHYSIOLOGY OFFICE NOTE  NAME:Selinger, DEIONNA MARCANTONIO                        MRN:          [id number] DATE:11/22/2010                            DOB:          [birth year]   HISTORY OF PRESENT ILLNESS:  Ms. Thatch returns today for a followup. She is a very pleasant 77 year old woman with history of Alzheimer's dementia, atrial flutter status post catheter ablation, atrial fibrillation well-controlled with low-dose amiodarone, hypertension, and dyslipidemia.  The patient was recently seen in the emergency room for chest pain several weeks ago.  She was noted to be hypertensive.  Her symptoms resolved, her chest pain resolved, and her blood pressure normalized.  She was discharged home.  Since then, she has been stable. Her main complaint is that of forgetfulness and dementia.  She denies chest pain, shortness of breath, or peripheral edema.  She remains quite active.  PHYSICAL EXAMINATION:  GENERAL:  She is a pleasant elderly woman in no acute distress. VITAL SIGNS:  Blood pressure was 124/62,  pulse was 83 and regular, respirations were 18, and weight was 199 pounds. NECK:  No jugular distention. LUNGS:  Clear bilaterally to auscultation.  No wheezes, rales, or rhonchi are present.  There is no increased work of breathing. CARDIAC:  Regular rate and rhythm.  Normal S1-S2. ABDOMEN:  Soft, nontender, and nondistended.  There is no organomegaly. EXTREMITIES:  No cyanosis, clubbing, or edema.  The pulses were 2+ and symmetric.  IMAGING:  Her EKG demonstrates sinus rhythm with normal axis and intervals.  IMPRESSION: 1. Chest pain, likely this is a supply demand situation secondary to     her significantly increased blood pressure.  She does not have a     history of coronary disease.  I have asked that she maintain a     period of watchful waiting.  She is instructed to call us if her     chest pain returns. 2.  Hypertension.  Her blood pressure is normal today.  Because of     this, I am not inclined to change her medications.  I have asked     that she maintain a low-sodium diet.  I will plan to see the     patient back in the office in several months.  She will continue     her current medical therapy.    Doylene Canning. Ladona Ridgel, MD    GWT/MedQ  DD: 11/22/2010  DT: 11/22/2010  Job #: [id number]

## 2010-11-28 DIAGNOSIS — R079 Chest pain, unspecified: Secondary | ICD-10-CM | POA: Insufficient documentation

## 2010-11-28 LAB — DIFFERENTIAL
Basophils Relative: 0
Eosinophils Absolute: 0.1
Lymphs Abs: 1.8
Monocytes Absolute: 0.6
Monocytes Relative: 10
Neutro Abs: 3.1
Neutrophils Relative %: 56

## 2010-11-28 LAB — I-STAT 8, (EC8 V) (CONVERTED LAB)
Acid-Base Excess: 2
BUN: 14
Chloride: 105
HCT: 42
Hemoglobin: 14.3
Operator id: 285841
Potassium: 3.1 — ABNORMAL LOW
Sodium: 139

## 2010-11-28 LAB — URINALYSIS, ROUTINE W REFLEX MICROSCOPIC
Bilirubin Urine: NEGATIVE
Nitrite: NEGATIVE
Specific Gravity, Urine: 1.011
Urobilinogen, UA: 1
pH: 7

## 2010-11-28 LAB — POCT CARDIAC MARKERS
CKMB, poc: 2.3
CKMB, poc: 2.5
Myoglobin, poc: 54.4
Troponin i, poc: <0.05
Troponin i, poc: <0.05

## 2010-11-28 LAB — URINE CULTURE: Colony Count: >=100000

## 2010-11-28 LAB — COMPREHENSIVE METABOLIC PANEL
ALT: 26
Albumin: 3.8
Alkaline Phosphatase: 57
Potassium: 3 — ABNORMAL LOW
Sodium: 139
Total Protein: 6.7

## 2010-11-28 LAB — CBC
Hemoglobin: 13.4
Platelets: 196
RDW: 14
WBC: 5.6

## 2010-11-28 LAB — APTT: aPTT: 27

## 2010-11-28 LAB — PROTIME-INR: INR: 1

## 2010-11-28 LAB — POCT I-STAT CREATININE
Creatinine, Ser: 1
Operator id: 285841

## 2010-11-28 NOTE — Progress Notes (Signed)
See dictated note.

## 2010-11-28 NOTE — Assessment & Plan Note (Signed)
See dictated note.

## 2010-12-01 NOTE — Progress Notes (Signed)
Addended by: Burnett Kanaris A on: 12/01/2010 02:07 PM   Modules accepted: Orders

## 2010-12-13 LAB — POCT CARDIAC MARKERS
Myoglobin, poc: 146
Operator id: 198171
Troponin i, poc: <0.05

## 2010-12-13 LAB — I-STAT 8, (EC8 V) (CONVERTED LAB)
Acid-Base Excess: 3 — ABNORMAL HIGH
Bicarbonate: 28.3 — ABNORMAL HIGH
Glucose, Bld: 105 — ABNORMAL HIGH
Hemoglobin: 13.9
Operator id: 198171
Sodium: 140
TCO2: 30

## 2011-02-01 ENCOUNTER — Encounter: Payer: Self-pay | Admitting: Internal Medicine

## 2011-04-26 ENCOUNTER — Encounter: Payer: Self-pay | Admitting: Internal Medicine

## 2011-05-02 ENCOUNTER — Ambulatory Visit (INDEPENDENT_AMBULATORY_CARE_PROVIDER_SITE_OTHER): Payer: Medicare Other | Admitting: Internal Medicine

## 2011-05-02 ENCOUNTER — Encounter: Payer: Self-pay | Admitting: Internal Medicine

## 2011-05-02 DIAGNOSIS — I4891 Unspecified atrial fibrillation: Secondary | ICD-10-CM

## 2011-05-02 DIAGNOSIS — I10 Essential (primary) hypertension: Secondary | ICD-10-CM

## 2011-05-02 NOTE — Progress Notes (Signed)
HPI [name] returns today for followup. She is a very pleasant elderly woman with dementia, atrial fibrillation, chronic amiodarone therapy, hypertension, and diastolic heart failure. The patient has remained quite stable since we saw her last a year ago. Her daughter who is with her today states that she still has problems with dementia but is not complaining of shortness of breath, chest pain, or palpitations. She remains fairly active in the nursing facility. No Known Allergies   Current Outpatient Prescriptions  Medication Sig Dispense Refill  . acetaminophen (TYLENOL) 325 MG tablet Take 650 mg by mouth every 6 (six) hours as needed. UAD       . ALPRAZolam (XANAX) 0.5 MG tablet Take 0.5 mg by mouth at bedtime as needed.        Marland Kitchen amiodarone (PACERONE) 200 MG tablet Take 200 mg by mouth daily.        . dabigatran (PRADAXA) 150 MG CAPS Take 150 mg by mouth every 12 (twelve) hours.        . diphenhydrAMINE-zinc acetate (BENADRYL) cream Apply topically 3 (three) times daily as needed.      . fluticasone (FLONASE) 50 MCG/ACT nasal spray Place 2 sprays into the nose daily.        Marland Kitchen galantamine (RAZADYNE ER) 24 MG 24 hr capsule Take 24 mg by mouth daily with breakfast.        . levothyroxine (SYNTHROID, LEVOTHROID) 100 MCG tablet Take 100 mcg by mouth daily.      Marland Kitchen levothyroxine (SYNTHROID, LEVOTHROID) 75 MCG tablet Take by mouth daily. Take 1/2 tablet      . loratadine (CLARITIN) 10 MG tablet Take 10 mg by mouth daily.        . nitroGLYCERIN (NITROSTAT) 0.4 MG SL tablet Place 0.4 mg under the tongue every 5 (five) minutes as needed.      . pantoprazole (PROTONIX) 40 MG tablet Take 40 mg by mouth daily.        . potassium chloride (KLOR-CON) 10 MEQ CR tablet Take 10 mEq by mouth daily.        Marland Kitchen triamcinolone cream (KENALOG) 0.1 % Apply 1 application topically 2 (two) times daily.      Marland Kitchen triamterene-hydrochlorothiazide (MAXZIDE-25) 37.5-25 MG per tablet Take 1 tablet by mouth daily.          Past Medical History  Diagnosis Date  . Hypertension   . Atrial flutter     paroxysmal  . GERD (gastroesophageal reflux disease)   . Dementia     ROS:   All systems reviewed and negative except as noted in the HPI.   Past Surgical History  Procedure Date  . Radiofrequency ablation 2001     Family History  Problem Relation Age of Onset  . Alzheimer's disease Father     dementia  . Other Mother     possible MS     History   Social History  . Marital Status: Married    Spouse Name: N/A    Number of Children: N/A  . Years of Education: N/A   Occupational History  . Not on file.   Social History Main Topics  . Smoking status: Never Smoker   . Smokeless tobacco: Never Used  . Alcohol Use: No  . Drug Use: No  . Sexually Active: Not on file   Other Topics Concern  . Not on file   Social History Narrative  . No narrative on file     BP 112/58  Pulse 59  Wt 89.721 kg (197 lb 12.8 oz)  Physical Exam:  Well appearing elderly woman, NAD HEENT: Unremarkable Neck:  No JVD, no thyromegally Lungs:  Clear with no wheezes, rales, or rhonchi. HEART:  Regular rate rhythm, no murmurs, no rubs, no clicks Abd:  soft, positive bowel sounds, no organomegally, no rebound, no guarding Ext:  2 plus pulses, no edema, no cyanosis, no clubbing Skin:  No rashes no nodules Neuro:  CN II through XII intact, motor grossly intact  EKG Sinus bradycardia.  Assess/Plan:

## 2011-05-02 NOTE — Assessment & Plan Note (Signed)
Her blood pressure is well controlled. She will continue her current medications and maintain a low-sodium diet. 

## 2011-05-02 NOTE — Assessment & Plan Note (Signed)
She appears to be maintaining sinus rhythm. The patient has had no trouble taking her low dose amiodarone.

## 2011-05-02 NOTE — Patient Instructions (Signed)
Your physician wants you to follow-up in: 12 months with Dr. Taylor. You will receive a reminder letter in the mail two months in advance. If you don't receive a letter, please call our office to schedule the follow-up appointment.    

## 2011-05-08 NOTE — Progress Notes (Signed)
Addended by: Judithe Modest D on: 05/08/2011 12:27 PM   Modules accepted: Orders

## 2012-02-01 ENCOUNTER — Inpatient Hospital Stay (HOSPITAL_COMMUNITY): Payer: Medicare Other

## 2012-02-01 ENCOUNTER — Emergency Department (HOSPITAL_COMMUNITY): Payer: Medicare Other

## 2012-02-01 ENCOUNTER — Encounter (HOSPITAL_COMMUNITY): Payer: Self-pay | Admitting: Registered Nurse

## 2012-02-01 ENCOUNTER — Inpatient Hospital Stay (HOSPITAL_COMMUNITY)
Admission: EM | Admit: 2012-02-01 | Discharge: 2012-02-05 | DRG: 493 | Disposition: A | Discharge status: Skilled Nursing Facility | Payer: Medicare Other | Attending: Emergency Medicine | Admitting: Emergency Medicine

## 2012-02-01 ENCOUNTER — Encounter (HOSPITAL_COMMUNITY)
Admission: EM | Disposition: A | Discharge status: Skilled Nursing Facility | Payer: Self-pay | Source: Home / Self Care | Attending: Orthopedic Surgery

## 2012-02-01 ENCOUNTER — Encounter (HOSPITAL_COMMUNITY): Payer: Self-pay | Admitting: *Deleted

## 2012-02-01 ENCOUNTER — Inpatient Hospital Stay (HOSPITAL_COMMUNITY): Payer: Medicare Other | Admitting: Registered Nurse

## 2012-02-01 DIAGNOSIS — S8263XA Displaced fracture of lateral malleolus of unspecified fibula, initial encounter for closed fracture: Principal | ICD-10-CM | POA: Diagnosis present

## 2012-02-01 DIAGNOSIS — S82843A Displaced bimalleolar fracture of unspecified lower leg, initial encounter for closed fracture: Secondary | ICD-10-CM

## 2012-02-01 DIAGNOSIS — Z87891 Personal history of nicotine dependence: Secondary | ICD-10-CM

## 2012-02-01 DIAGNOSIS — Z6832 Body mass index (BMI) 32.0-32.9, adult: Secondary | ICD-10-CM

## 2012-02-01 DIAGNOSIS — Y9210 Unspecified residential institution as the place of occurrence of the external cause: Secondary | ICD-10-CM | POA: Diagnosis present

## 2012-02-01 DIAGNOSIS — I5030 Unspecified diastolic (congestive) heart failure: Secondary | ICD-10-CM

## 2012-02-01 DIAGNOSIS — W19XXXA Unspecified fall, initial encounter: Secondary | ICD-10-CM | POA: Diagnosis present

## 2012-02-01 DIAGNOSIS — J4489 Other specified chronic obstructive pulmonary disease: Secondary | ICD-10-CM | POA: Diagnosis present

## 2012-02-01 DIAGNOSIS — I4891 Unspecified atrial fibrillation: Secondary | ICD-10-CM | POA: Diagnosis present

## 2012-02-01 DIAGNOSIS — S82892A Other fracture of left lower leg, initial encounter for closed fracture: Secondary | ICD-10-CM

## 2012-02-01 DIAGNOSIS — Z791 Long term (current) use of non-steroidal anti-inflammatories (NSAID): Secondary | ICD-10-CM

## 2012-02-01 DIAGNOSIS — E039 Hypothyroidism, unspecified: Secondary | ICD-10-CM | POA: Diagnosis present

## 2012-02-01 DIAGNOSIS — K219 Gastro-esophageal reflux disease without esophagitis: Secondary | ICD-10-CM | POA: Diagnosis present

## 2012-02-01 DIAGNOSIS — I509 Heart failure, unspecified: Secondary | ICD-10-CM | POA: Diagnosis present

## 2012-02-01 DIAGNOSIS — E669 Obesity, unspecified: Secondary | ICD-10-CM | POA: Diagnosis present

## 2012-02-01 DIAGNOSIS — I10 Essential (primary) hypertension: Secondary | ICD-10-CM | POA: Diagnosis present

## 2012-02-01 DIAGNOSIS — Z79899 Other long term (current) drug therapy: Secondary | ICD-10-CM

## 2012-02-01 DIAGNOSIS — S93439A Sprain of tibiofibular ligament of unspecified ankle, initial encounter: Secondary | ICD-10-CM | POA: Diagnosis present

## 2012-02-01 DIAGNOSIS — J449 Chronic obstructive pulmonary disease, unspecified: Secondary | ICD-10-CM | POA: Diagnosis present

## 2012-02-01 HISTORY — DX: Chronic obstructive pulmonary disease, unspecified: J44.9

## 2012-02-01 HISTORY — PX: ORIF ANKLE FRACTURE: SHX5408

## 2012-02-01 LAB — CBC
HCT: 40 % (ref 36.0–46.0)
Hemoglobin: 13.5 g/dL (ref 12.0–15.0)
RBC: 4.48 MIL/uL (ref 3.87–5.11)
WBC: 8.4 10*3/uL (ref 4.0–10.5)

## 2012-02-01 LAB — BASIC METABOLIC PANEL
BUN: 18 mg/dL (ref 6–23)
CO2: 25 mEq/L (ref 19–32)
Chloride: 104 mEq/L (ref 96–112)
Glucose, Bld: 123 mg/dL — ABNORMAL HIGH (ref 70–99)
Potassium: 3.3 mEq/L — ABNORMAL LOW (ref 3.5–5.1)
Sodium: 141 mEq/L (ref 135–145)

## 2012-02-01 LAB — PRO B NATRIURETIC PEPTIDE: Pro B Natriuretic peptide (BNP): 274.9 pg/mL (ref 0–450)

## 2012-02-01 SURGERY — OPEN REDUCTION INTERNAL FIXATION (ORIF) ANKLE FRACTURE
Anesthesia: General | Laterality: Left | Wound class: Clean

## 2012-02-01 MED ORDER — CEFAZOLIN SODIUM-DEXTROSE 2-3 GM-% IV SOLR
INTRAVENOUS | Status: DC | PRN
  Administered 2012-02-01: 2 g via INTRAVENOUS

## 2012-02-01 MED ORDER — BUPIVACAINE-EPINEPHRINE PF 0.25-1:200000 % IJ SOLN
INTRAMUSCULAR | Status: AC
  Filled 2012-02-01: qty 30

## 2012-02-01 MED ORDER — LIDOCAINE HCL (CARDIAC) 20 MG/ML IV SOLN
INTRAVENOUS | Status: DC | PRN
  Administered 2012-02-01: 80 mg via INTRAVENOUS

## 2012-02-01 MED ORDER — ACETAMINOPHEN 10 MG/ML IV SOLN
INTRAVENOUS | Status: AC
  Filled 2012-02-01: qty 100

## 2012-02-01 MED ORDER — AMIODARONE HCL 200 MG PO TABS
200.0000 mg | ORAL_TABLET | Freq: Every day | ORAL | Status: DC
  Administered 2012-02-02 – 2012-02-05 (×4): 200 mg via ORAL
  Filled 2012-02-01 (×4): qty 1

## 2012-02-01 MED ORDER — ACETAMINOPHEN 10 MG/ML IV SOLN
INTRAVENOUS | Status: DC | PRN
  Administered 2012-02-01: 1000 mg via INTRAVENOUS

## 2012-02-01 MED ORDER — PANTOPRAZOLE SODIUM 40 MG PO TBEC
40.0000 mg | DELAYED_RELEASE_TABLET | Freq: Every day | ORAL | Status: DC
  Administered 2012-02-01 – 2012-02-05 (×5): 40 mg via ORAL
  Filled 2012-02-01 (×5): qty 1

## 2012-02-01 MED ORDER — HYDROCODONE-ACETAMINOPHEN 5-325 MG PO TABS: 1.0000 | ORAL_TABLET | ORAL | Status: DC | PRN

## 2012-02-01 MED ORDER — LORAZEPAM 2 MG/ML IJ SOLN: 1.0000 mg | Freq: Once | INTRAMUSCULAR | Status: DC

## 2012-02-01 MED ORDER — LIDOCAINE HCL 1 % IJ SOLN
INTRAMUSCULAR | Status: AC
  Filled 2012-02-01: qty 20

## 2012-02-01 MED ORDER — LACTATED RINGERS IV SOLN
INTRAVENOUS | Status: DC | PRN
  Administered 2012-02-01: 21:00:00 via INTRAVENOUS

## 2012-02-01 MED ORDER — ALPRAZOLAM 0.5 MG PO TABS
0.5000 mg | ORAL_TABLET | Freq: Two times a day (BID) | ORAL | Status: DC
  Administered 2012-02-02 – 2012-02-05 (×6): 0.5 mg via ORAL
  Filled 2012-02-01 (×7): qty 1

## 2012-02-01 MED ORDER — ONDANSETRON HCL 4 MG/2ML IJ SOLN
INTRAMUSCULAR | Status: DC | PRN
  Administered 2012-02-01: 4 mg via INTRAVENOUS

## 2012-02-01 MED ORDER — LORAZEPAM 2 MG/ML IJ SOLN
1.0000 mg | Freq: Four times a day (QID) | INTRAMUSCULAR | Status: DC | PRN
  Administered 2012-02-01 – 2012-02-02 (×4): 1 mg via INTRAVENOUS
  Filled 2012-02-01 (×6): qty 1

## 2012-02-01 MED ORDER — PROPOFOL 10 MG/ML IV BOLUS
INTRAVENOUS | Status: DC | PRN
  Administered 2012-02-01: 150 mg via INTRAVENOUS

## 2012-02-01 MED ORDER — LEVOTHYROXINE SODIUM 100 MCG PO TABS
100.0000 ug | ORAL_TABLET | Freq: Every day | ORAL | Status: DC
  Administered 2012-02-02 – 2012-02-05 (×4): 100 ug via ORAL
  Filled 2012-02-01 (×5): qty 1

## 2012-02-01 MED ORDER — CEFAZOLIN SODIUM-DEXTROSE 2-3 GM-% IV SOLR
INTRAVENOUS | Status: AC
  Filled 2012-02-01: qty 50

## 2012-02-01 MED ORDER — BUPIVACAINE-EPINEPHRINE PF 0.25-1:200000 % IJ SOLN
INTRAMUSCULAR | Status: DC | PRN
  Administered 2012-02-01: 4 mL

## 2012-02-01 MED ORDER — HYDRALAZINE HCL 20 MG/ML IJ SOLN
10.0000 mg | Freq: Four times a day (QID) | INTRAMUSCULAR | Status: DC | PRN
  Administered 2012-02-02: 10 mg via INTRAVENOUS
  Filled 2012-02-01: qty 0.5

## 2012-02-01 MED ORDER — FENTANYL CITRATE 0.05 MG/ML IJ SOLN
INTRAMUSCULAR | Status: DC | PRN
  Administered 2012-02-01 – 2012-02-02 (×4): 50 ug via INTRAVENOUS

## 2012-02-01 MED ORDER — ROCURONIUM BROMIDE 100 MG/10ML IV SOLN
INTRAVENOUS | Status: DC | PRN
  Administered 2012-02-01: 10 mg via INTRAVENOUS
  Administered 2012-02-01: 20 mg via INTRAVENOUS

## 2012-02-01 MED ORDER — SUCCINYLCHOLINE CHLORIDE 20 MG/ML IJ SOLN
INTRAMUSCULAR | Status: DC | PRN
  Administered 2012-02-01: 100 mg via INTRAVENOUS

## 2012-02-01 MED ORDER — LIDOCAINE HCL (PF) 1 % IJ SOLN
5.0000 mL | Freq: Once | INTRAMUSCULAR | Status: AC
  Administered 2012-02-01: 5 mL via INTRADERMAL
  Filled 2012-02-01: qty 5

## 2012-02-01 MED ORDER — HYDROCODONE-ACETAMINOPHEN 5-325 MG PO TABS
1.0000 | ORAL_TABLET | Freq: Four times a day (QID) | ORAL | Status: DC | PRN
  Administered 2012-02-01 – 2012-02-05 (×6): 1 via ORAL
  Filled 2012-02-01 (×7): qty 1

## 2012-02-01 MED ORDER — NITROGLYCERIN 0.4 MG SL SUBL: 0.4000 mg | SUBLINGUAL_TABLET | SUBLINGUAL | Status: DC | PRN

## 2012-02-01 MED ORDER — 0.9 % SODIUM CHLORIDE (POUR BTL) OPTIME
TOPICAL | Status: DC | PRN
  Administered 2012-02-01: 1000 mL

## 2012-02-01 MED ORDER — CHOLECALCIFEROL 10 MCG (400 UNIT) PO TABS: 400.0000 [IU] | ORAL_TABLET | Freq: Every day | ORAL | Status: DC

## 2012-02-01 MED ORDER — TRIAMTERENE-HCTZ 37.5-25 MG PO TABS: 1.0000 | ORAL_TABLET | Freq: Every day | ORAL | Status: DC

## 2012-02-01 MED ORDER — FLUTICASONE PROPIONATE 50 MCG/ACT NA SUSP
2.0000 | Freq: Every day | NASAL | Status: DC
  Administered 2012-02-02 – 2012-02-05 (×3): 2 via NASAL
  Filled 2012-02-01: qty 16

## 2012-02-01 MED ORDER — TRIAMTERENE-HCTZ 37.5-25 MG PO TABS
1.0000 | ORAL_TABLET | Freq: Every day | ORAL | Status: DC
  Administered 2012-02-01 – 2012-02-05 (×5): 1 via ORAL
  Filled 2012-02-01 (×5): qty 1

## 2012-02-01 MED ORDER — GALANTAMINE HYDROBROMIDE ER 24 MG PO CP24
24.0000 mg | ORAL_CAPSULE | Freq: Every day | ORAL | Status: DC
  Filled 2012-02-01 (×3): qty 1

## 2012-02-01 MED ORDER — MORPHINE SULFATE 2 MG/ML IJ SOLN: 1.0000 mg | INTRAMUSCULAR | Status: DC | PRN

## 2012-02-01 MED ORDER — ACETAMINOPHEN 325 MG PO TABS: 650.0000 mg | ORAL_TABLET | Freq: Four times a day (QID) | ORAL | Status: DC | PRN

## 2012-02-01 SURGICAL SUPPLY — 57 items
BAG SPEC THK2 15X12 ZIP CLS (MISCELLANEOUS) ×1
BAG ZIPLOCK 12X15 (MISCELLANEOUS) ×2 IMPLANT
BANDAGE ELASTIC 4 VELCRO ST LF (GAUZE/BANDAGES/DRESSINGS) ×1 IMPLANT
BANDAGE ELASTIC 6 VELCRO ST LF (GAUZE/BANDAGES/DRESSINGS) ×1 IMPLANT
BIT DRILL 2.5X110 QC LCP DISP (BIT) ×1 IMPLANT
BIT DRILL QC 3.5X110 (BIT) ×1 IMPLANT
CLOTH BEACON ORANGE TIMEOUT ST (SAFETY) ×2 IMPLANT
CUFF TOURN SGL QUICK 34 (TOURNIQUET CUFF) ×2
CUFF TRNQT CYL 34X4X40X1 (TOURNIQUET CUFF) ×1 IMPLANT
DRAPE C-ARM 42X72 X-RAY (DRAPES) ×2 IMPLANT
DRAPE U-SHAPE 47X51 STRL (DRAPES) ×2 IMPLANT
DRSG ADAPTIC 3X8 NADH LF (GAUZE/BANDAGES/DRESSINGS) ×2 IMPLANT
DRSG PAD ABDOMINAL 8X10 ST (GAUZE/BANDAGES/DRESSINGS) ×2 IMPLANT
DURAPREP 26ML APPLICATOR (WOUND CARE) ×2 IMPLANT
ELECT REM PT RETURN 9FT ADLT (ELECTROSURGICAL) ×2
ELECTRODE REM PT RTRN 9FT ADLT (ELECTROSURGICAL) ×1 IMPLANT
GAUZE SPONGE 4X4 16PLY XRAY LF (GAUZE/BANDAGES/DRESSINGS) ×1 IMPLANT
GAUZE XEROFORM 1X8 LF (GAUZE/BANDAGES/DRESSINGS) ×1 IMPLANT
GLOVE BIOGEL PI IND STRL 7.0 (GLOVE) IMPLANT
GLOVE BIOGEL PI IND STRL 7.5 (GLOVE) IMPLANT
GLOVE BIOGEL PI IND STRL 8 (GLOVE) IMPLANT
GLOVE BIOGEL PI INDICATOR 7.0 (GLOVE) ×1
GLOVE BIOGEL PI INDICATOR 7.5 (GLOVE) ×1
GLOVE BIOGEL PI INDICATOR 8 (GLOVE) ×1
GLOVE ECLIPSE 7.5 STRL STRAW (GLOVE) ×2 IMPLANT
KIT 1/3 TUB PL 8H 97M (Orthopedic Implant) IMPLANT
KIT BASIN OR (CUSTOM PROCEDURE TRAY) ×2 IMPLANT
MANIFOLD NEPTUNE II (INSTRUMENTS) ×2 IMPLANT
MAXTRAX WALKER ×1 IMPLANT
NS IRRIG 1000ML POUR BTL (IV SOLUTION) ×2 IMPLANT
PACK LOWER EXTREMITY WL (CUSTOM PROCEDURE TRAY) ×2 IMPLANT
PAD CAST 4YDX4 CTTN HI CHSV (CAST SUPPLIES) ×2 IMPLANT
PADDING CAST COTTON 4X4 STRL (CAST SUPPLIES) ×4
PADDING CAST COTTON 6X4 STRL (CAST SUPPLIES) ×1 IMPLANT
POSITIONER SURGICAL ARM (MISCELLANEOUS) ×2 IMPLANT
PROS 1/3 TUB PL 8H 97M (Orthopedic Implant) ×2 IMPLANT
SCREW CANC FT 4.0X22 (Screw) ×1 IMPLANT
SCREW CANC FT 4.0X24 (Screw) ×1 IMPLANT
SCREW CANC FT ST SFS 4X16 (Screw) ×1 IMPLANT
SCREW CORTEX 3.5 12MM (Screw) ×4 IMPLANT
SCREW CORTEX 3.5 14MM (Screw) ×1 IMPLANT
SCREW CORTEX 3.5 16MM (Screw) ×2 IMPLANT
SCREW LOCK CORT ST 3.5X12 (Screw) IMPLANT
SCREW LOCK CORT ST 3.5X14 (Screw) IMPLANT
SCREW LOCK CORT ST 3.5X16 (Screw) IMPLANT
SPONGE GAUZE 4X4 12PLY (GAUZE/BANDAGES/DRESSINGS) ×2 IMPLANT
STRIP CLOSURE SKIN 1/2X4 (GAUZE/BANDAGES/DRESSINGS) ×2 IMPLANT
SUCTION FRAZIER 12FR DISP (SUCTIONS) ×1 IMPLANT
SUT ETHILON 2 0 PS N (SUTURE) ×3 IMPLANT
SUT MNCRL AB 4-0 PS2 18 (SUTURE) ×2 IMPLANT
SUT VIC AB 0 CT1 18XCR BRD 8 (SUTURE) IMPLANT
SUT VIC AB 0 CT1 8-18 (SUTURE) ×4
SUT VIC AB 1 CT1 27 (SUTURE) ×4
SUT VIC AB 1 CT1 27XBRD ANTBC (SUTURE) ×2 IMPLANT
SUT VIC AB 2-0 CT1 27 (SUTURE) ×2
SUT VIC AB 2-0 CT1 TAPERPNT 27 (SUTURE) ×1 IMPLANT
SUT VIC AB 2-0 SH 18 (SUTURE) ×1 IMPLANT

## 2012-02-01 NOTE — ED Notes (Signed)
Bed:WA05<BR> Expected date:<BR> Expected time:<BR> Means of arrival:<BR> Comments:<BR> fall

## 2012-02-01 NOTE — ED Provider Notes (Signed)
History     CSN: [id number]  Arrival date & time 02/01/12  1132   First MD Initiated Contact with Patient 02/01/12 1136      Chief Complaint  Patient presents with  . Fall    (Consider location/radiation/quality/duration/timing/severity/associated sxs/prior treatment) HPI The patient presents after an unwitnessed fall with pain in her left ankle.  The patient has dementia, level V caveat, per nursing home staff the patient complained of left ankle pain this morning, described fall.  The patient denies pain anywhere other than the ankle, but cannot describe any aspects of the fall reliably. The patient presents with him the numbers to relate the patient is behaving at her baseline, seems otherwise well. Past Medical History  Diagnosis Date  . Hypertension   . Atrial flutter     paroxysmal  . GERD (gastroesophageal reflux disease)   . Dementia     Past Surgical History  Procedure Date  . Radiofrequency ablation 2001    Family History  Problem Relation Age of Onset  . Alzheimer's disease Father     dementia  . Other Mother     possible MS    History  Substance Use Topics  . Smoking status: Never Smoker   . Smokeless tobacco: Never Used  . Alcohol Use: No    OB History    Grav Para Term Preterm Abortions TAB SAB Ect Mult Living                  Review of Systems  Unable to perform ROS: Dementia    Allergies  Review of patient's allergies indicates no known allergies.  Home Medications   Current Outpatient Rx  Name  Route  Sig  Dispense  Refill  . ACETAMINOPHEN 325 MG PO TABS   Oral   Take 650 mg by mouth every 6 (six) hours as needed. UAD          . ALPRAZOLAM 0.5 MG PO TABS   Oral   Take 0.5 mg by mouth 2 (two) times daily.          . AMIODARONE HCL 200 MG PO TABS   Oral   Take 200 mg by mouth daily.           . CHOLECALCIFEROL 400 UNITS PO TABS   Oral   Take 400 Units by mouth daily.         Marland Kitchen DABIGATRAN ETEXILATE MESYLATE 75 MG  PO CAPS   Oral   Take 75 mg by mouth 2 (two) times daily.         Marland Kitchen FLUTICASONE PROPIONATE 50 MCG/ACT NA SUSP   Nasal   Place 2 sprays into the nose daily.           Marland Kitchen GALANTAMINE HYDROBROMIDE ER 24 MG PO CP24   Oral   Take 24 mg by mouth daily with breakfast.           . LEVOTHYROXINE SODIUM 100 MCG PO TABS   Oral   Take 100 mcg by mouth daily.         Marland Kitchen LEVOTHYROXINE SODIUM 75 MCG PO TABS   Oral   Take by mouth daily. Take 1/2 tablet         . LORATADINE 10 MG PO TABS   Oral   Take 10 mg by mouth daily.           Marland Kitchen NITROGLYCERIN 0.4 MG SL SUBL   Sublingual   Place 0.4 mg under the tongue  every 5 (five) minutes as needed.         Marland Kitchen PANTOPRAZOLE SODIUM 40 MG PO TBEC   Oral   Take 40 mg by mouth daily.           Marland Kitchen POTASSIUM CHLORIDE 10 MEQ PO TBCR   Oral   Take 10 mEq by mouth daily.           . TRIAMCINOLONE ACETONIDE 0.1 % EX CREA   Topical   Apply 1 application topically 2 (two) times daily.         . TRIAMTERENE-HCTZ 37.5-25 MG PO TABS   Oral   Take 1 tablet by mouth daily.           BP 189/54  Pulse 58  Temp 98.2 F (36.8 C) (Oral)  Resp 16  SpO2 99%  Physical Exam  Nursing note and vitals reviewed. Constitutional: She is oriented to person, place, and time. She appears well-developed and well-nourished. No distress.  HENT:  Head: Normocephalic and atraumatic.  Eyes: Conjunctivae normal and EOM are normal.  Neck: Full passive range of motion without pain. No spinous process tenderness present.  Cardiovascular: Normal rate and regular rhythm.   Pulmonary/Chest: Effort normal and breath sounds normal. No stridor. No respiratory distress.  Abdominal: She exhibits no distension.  Musculoskeletal: She exhibits no edema.       Left ankle: She exhibits decreased range of motion, swelling, ecchymosis and deformity. She exhibits no laceration and normal pulse. tenderness. Lateral malleolus, medial malleolus, AITFL, CF ligament and  posterior TFL tenderness found. No head of 5th metatarsal and no proximal fibula tenderness found. Achilles tendon normal.  Neurological: She is alert and oriented to person, place, and time. No cranial nerve deficit.  Skin: Skin is warm and dry.  Psychiatric: She has a normal mood and affect.    ED Course  ORTHOPEDIC INJURY TREATMENT Date/Time: 02/01/2012 4:27 PM Performed by: Gerhard Munch Authorized by: Gerhard Munch Consent: Verbal consent obtained. The procedure was performed in an emergent situation. Risks and benefits: risks, benefits and alternatives were discussed Consent given by: power of attorney Patient identity confirmed: verbally with patient Time out: Immediately prior to procedure a "time out" was called to verify the correct patient, procedure, equipment, support staff and site/side marked as required. Injury location: ankle Location details: left ankle Injury type: fracture Fracture type: bimalleolar Pre-procedure neurovascular assessment: neurovascularly intact Pre-procedure distal perfusion: normal Pre-procedure neurological function: normal Pre-procedure range of motion: reduced Local anesthesia used: yes Anesthesia: hematoma block Local anesthetic: lidocaine 2% without epinephrine Anesthetic total: 8 ml Manipulation performed: yes Skeletal traction used: yes Reduction successful: no Immobilization: splint Splint type: short leg Supplies used: Ortho-Glass Post-procedure neurovascular assessment: post-procedure neurovascularly intact Post-procedure distal perfusion: normal Post-procedure neurological function: normal Post-procedure range of motion: unchanged Patient tolerance: Patient tolerated the procedure well with no immediate complications.   (including critical care time)  Labs Reviewed - No data to display Dg Ankle Complete Left  02/01/2012  *RADIOLOGY REPORT*  Clinical Data: Fall, left ankle swelling and ecchymosis  LEFT ANKLE COMPLETE -  3+ VIEW  Comparison: None.  Findings: There is an oblique fracture of the distal left fibula with overlying soft tissue swelling.  Approximately one half shaft width fracture fragment overlap.  There is widening of the medial aspect of the ankle mortise.  Tiny avulsion fracture fragments are noted adjacent to the medial malleolus with overlying soft tissue swelling.  No radiopaque foreign body.  IMPRESSION: Bimalleolar fracture with widening  of the medial aspect of the ankle mortise.   Original Report Authenticated By: Christiana Pellant, M.D.    Ct Head Wo Contrast  02/01/2012  *RADIOLOGY REPORT*  Clinical Data: 78 year old female with fall, altered mental status and on anticoagulants.  CT HEAD WITHOUT CONTRAST  Technique:  Contiguous axial images were obtained from the base of the skull through the vertex without contrast.  Comparison: 04/13/2009 CT  Findings: Atrophy and chronic small vessel Barnier matter ischemic changes are again identified.  No acute intracranial abnormalities are identified, including mass lesion or mass effect, hydrocephalus, extra-axial fluid collection, midline shift, hemorrhage, or acute infarction.  The visualized bony calvarium is unremarkable. Mild mucosal thickening / fluid within the right frontal and ethmoid sinuses is unchanged.  IMPRESSION: No evidence of acute intracranial abnormality.  Atrophy and chronic small vessel Myles matter ischemic changes.  Unchanged paranasal sinus disease/sinusitis.   Original Report Authenticated By: Harmon Pier, M.D.      No diagnosis found.  I interpreted the x-rays discussed findings with the patient's family.  MDM  This elderly female with multiple medical problems presents after a mechanical fall with a left ankle bimalleolar fracture.  The patient's fracture was reduced, though with minimal change in position in the emergency department.  The patient was neurologically intact distally, with no tenting of the skin, appropriate pulses, cap  refill, sensation, distal motor capacity.  The patient was admitted by the orthopedic service for further evaluation and management.  Given the patient's dementia, her daughter is the power of attorney, and consents to all treatment.    Gerhard Munch, MD 02/01/12 [phone]

## 2012-02-01 NOTE — ED Notes (Signed)
Attempted to call report to [address] [place].  Nurse unavailable to take report.

## 2012-02-01 NOTE — Preoperative (Signed)
Beta Blockers   Reason not to administer Beta Blockers:Not Applicable 

## 2012-02-01 NOTE — ED Notes (Addendum)
Pt was at [facility] facility and slipped and fell on concrete. Pt did not hit her head, she fell to her bottom; no loss of consciousness noted. Pt went inside facility and told staff that she had fallen. Pt able to move all extremities per EMS.

## 2012-02-01 NOTE — Consult Note (Signed)
ORTHO COMNSULT  Chief Complaint: Left ankle pain  HPI: Hannah Bowers is a 78 y.o. female who presented to the ED with left ankle pain s/p a fall earlier today. Patient reported moderate sharp pain in left ankle. Patient was diagnosed with a left ankle fracture. I was called after an attempted closed reduction, but films revealed ankle fracture with a persistent ankle dislocation. Patient known to have dementia and resides in an assisted living facility. Pain was slightly improved with medication.   Past Medical History  Diagnosis Date  . Hypertension   . Atrial flutter     paroxysmal  . GERD (gastroesophageal reflux disease)   . Dementia   . COPD (chronic obstructive pulmonary disease)    Past Surgical History  Procedure Date  . Radiofrequency ablation 2001   History   Social History  . Marital Status: Married    Spouse Name: N/A    Number of Children: N/A  . Years of Education: N/A   Social History Main Topics  . Smoking status: Former Smoker -- 0.2 packs/day for 4 years    Quit date: 01/31/1978  . Smokeless tobacco: Never Used  . Alcohol Use: No  . Drug Use: No  . Sexually Active: None   Other Topics Concern  . None   Social History Narrative  . None   Family History  Problem Relation Age of Onset  . Alzheimer's disease Father     dementia  . Other Mother     possible MS   No Known Allergies Prior to Admission medications   Medication Sig Start Date End Date Taking? Authorizing Provider  acetaminophen (TYLENOL) 325 MG tablet Take 650 mg by mouth every 6 (six) hours as needed. UAD    Yes Historical Provider, MD  ALPRAZolam (XANAX) 0.5 MG tablet Take 0.5 mg by mouth 2 (two) times daily.    Yes Historical Provider, MD  amiodarone (PACERONE) 200 MG tablet Take 200 mg by mouth daily.     Yes Historical Provider, MD  cholecalciferol (VITAMIN D) 400 UNITS TABS Take 400 Units by mouth daily.   Yes Historical Provider, MD  dabigatran (PRADAXA) 75 MG CAPS Take 75 mg  by mouth 2 (two) times daily.   Yes Historical Provider, MD  fluticasone (FLONASE) 50 MCG/ACT nasal spray Place 2 sprays into the nose daily.     Yes Historical Provider, MD  galantamine (RAZADYNE ER) 24 MG 24 hr capsule Take 24 mg by mouth daily with breakfast.     Yes Historical Provider, MD  levothyroxine (SYNTHROID, LEVOTHROID) 100 MCG tablet Take 100 mcg by mouth daily.   Yes Historical Provider, MD  levothyroxine (SYNTHROID, LEVOTHROID) 75 MCG tablet Take by mouth daily. Take 1/2 tablet   Yes Historical Provider, MD  loratadine (CLARITIN) 10 MG tablet Take 10 mg by mouth daily.     Yes Historical Provider, MD  nitroGLYCERIN (NITROSTAT) 0.4 MG SL tablet Place 0.4 mg under the tongue every 5 (five) minutes as needed.   Yes Historical Provider, MD  pantoprazole (PROTONIX) 40 MG tablet Take 40 mg by mouth daily.     Yes Historical Provider, MD  potassium chloride (KLOR-CON) 10 MEQ CR tablet Take 10 mEq by mouth daily.     Yes Historical Provider, MD  triamcinolone cream (KENALOG) 0.1 % Apply 1 application topically 2 (two) times daily.   Yes Historical Provider, MD  triamterene-hydrochlorothiazide (MAXZIDE-25) 37.5-25 MG per tablet Take 1 tablet by mouth daily.   Yes Historical Provider, MD  All other systems have been reviewed and were otherwise negative with the exception of those mentioned in the HPI and as above.  Physical Exam: Filed Vitals:   02/01/12 1820  BP:   Pulse: 67  Temp:   Resp:     General: Patient demented and not oriented to place or time Cardiovascular: No pedal edema Respiratory: No cyanosis, no use of accessory musculature GI: No organomegaly, abdomen is soft and non-tender Skin: No lesions in the area of chief complaint Neurologic: Sensation intact distally Psychiatric: Patient is competent for consent with normal mood and affect Lymphatic: No axillary or cervical lymphadenopathy  MUSCULOSKELETAL: + swelling left ankle, skin intact. + min swelling. Cap  refill < 2 seconds. Pain with passive movement of left ankle.   Assessment/Plan: Left ankle fracture with dislocated left ankle joint  Plan I did discuss with patient daughter, the nature of her mother's fracture. This can generally be treated on an outpatient basis, but the ankle is dislocated and the patient is demented, which raises concerns regarding compliance with NWB in a splint, which may worsen the injury. A discussion was therefore had regarding ORID of patient ankle tonight in order to restore patient ankle mortise and increase her function sooner. Delayed fixation may lead to a less favorable outcome. I did discuss the risks of infection, neurovascular injury, nonunion, DVT, and persistent ankle pain. I do also have concerns with patient's possible noncompliance of the NWB that will be required, which may lead to failure of the hardware and the need for additional surgery. The patient ate crackers at 4pm, so the current plan is to go ahead with operative intervention at approximately 10pm tonight.        Emilee Hero, MD 02/01/2012 6:42 PM

## 2012-02-01 NOTE — ED Notes (Signed)
Report attempted, RN unavailable.

## 2012-02-01 NOTE — Consult Note (Addendum)
[facility] Medical Consultation  Hannah Bowers ZOX:[id number] DOB: 07-31-1930 DOA: 02/01/2012 PCP: No primary provider on file.   Requesting physician: Dr Jeraldine Loots. Date of consultation: 11-28 Reason for consultation: Medical Management.   Impression/Recommendations  1. A fib: Patient's HR at this time is sinus Rhythm. I will continue with amiodarone. I will hold Pradaxa anticipating surgery. Will need need to resume Pradaxa post surgery when ortho consider appropriate.  2. Hypertension: I will continue with home dose Maxzide. Will need to follow B-met, to evaluate renal function. I Will order PRN Hydralazine for better blood pressure control.  3. Hypothyroidism: I will continue with synthroid. I will check TSH.  4. COPD: No evidence of acute exacerbation.  5. Diastolic Hearth Failure: Appear compensated. Chest X ray with mild fluid overload. Resume diuretic (Maxzide). 6. Left Ankle Fracture: Patient is at  Moderate risk for surgery due to multiple comorbidity, but i think  benefit outweigh risk.   I will followup again tomorrow. Please contact me if I can be of assistance in the meanwhile. Thank you for this consultation.  Chief Complaint: Ankle pain.   HPI:  78 year old with PMH significant for Dementia, Hypertension, Paroxymal A Fib, who had an unwitness fall.  Patient does not recall the event. She was found on the floor complaining of left ankle pain. No evidence of loss of consciousness. Patient denies chest pain or dyspnea on exertion. She is very active, and able to participate on daily living activities. She has dementia, primary memory problem.   Review of Systems:  Constitutional:  No weight loss, night sweats, Fevers, chills, fatigue.  HEENT:  No headaches, Difficulty swallowing,Tooth/dental problems,Sore throat,  No sneezing, itching, ear ache, nasal congestion, post nasal drip,  Cardio-vascular:  No chest pain, Orthopnea, PND, swelling in lower extremities,  anasarca, dizziness, palpitations  GI:  No heartburn, indigestion, abdominal pain, nausea, vomiting, diarrhea, change in bowel habits, loss of appetite  Resp:  No shortness of breath with exertion or at rest. No excess mucus, no productive cough, No non-productive cough, No coughing up of blood.No change in color of mucus.No wheezing.No chest wall deformity  Skin:  no rash or lesions.  GU:  no dysuria, change in color of urine, no urgency or frequency. No flank pain.  Psych:  No change in mood or affect. No depression or anxiety.     Past Medical History  Diagnosis Date  . Hypertension   . Atrial flutter     paroxysmal  . GERD (gastroesophageal reflux disease)   . Dementia   . COPD (chronic obstructive pulmonary disease)    Past Surgical History  Procedure Date  . Radiofrequency ablation 2001   Social History:  reports that she quit smoking about 34 years ago. She has never used smokeless tobacco. She reports that she does not drink alcohol or use illicit drugs.  No Known Allergies Family History  Problem Relation Age of Onset  . Alzheimer's disease Father     dementia  . Other Mother     possible MS    Prior to Admission medications   Medication Sig Start Date End Date Taking? Authorizing Provider  acetaminophen (TYLENOL) 325 MG tablet Take 650 mg by mouth every 6 (six) hours as needed. UAD    Yes Historical Provider, MD  ALPRAZolam (XANAX) 0.5 MG tablet Take 0.5 mg by mouth 2 (two) times daily.    Yes Historical Provider, MD  amiodarone (PACERONE) 200 MG tablet Take 200 mg by mouth daily.  Yes Historical Provider, MD  cholecalciferol (VITAMIN D) 400 UNITS TABS Take 400 Units by mouth daily.   Yes Historical Provider, MD  dabigatran (PRADAXA) 75 MG CAPS Take 75 mg by mouth 2 (two) times daily.   Yes Historical Provider, MD  fluticasone (FLONASE) 50 MCG/ACT nasal spray Place 2 sprays into the nose daily.     Yes Historical Provider, MD  galantamine (RAZADYNE ER) 24 MG  24 hr capsule Take 24 mg by mouth daily with breakfast.     Yes Historical Provider, MD  levothyroxine (SYNTHROID, LEVOTHROID) 100 MCG tablet Take 100 mcg by mouth daily.   Yes Historical Provider, MD  levothyroxine (SYNTHROID, LEVOTHROID) 75 MCG tablet Take by mouth daily. Take 1/2 tablet   Yes Historical Provider, MD  loratadine (CLARITIN) 10 MG tablet Take 10 mg by mouth daily.     Yes Historical Provider, MD  nitroGLYCERIN (NITROSTAT) 0.4 MG SL tablet Place 0.4 mg under the tongue every 5 (five) minutes as needed.   Yes Historical Provider, MD  pantoprazole (PROTONIX) 40 MG tablet Take 40 mg by mouth daily.     Yes Historical Provider, MD  potassium chloride (KLOR-CON) 10 MEQ CR tablet Take 10 mEq by mouth daily.     Yes Historical Provider, MD  triamcinolone cream (KENALOG) 0.1 % Apply 1 application topically 2 (two) times daily.   Yes Historical Provider, MD  triamterene-hydrochlorothiazide (MAXZIDE-25) 37.5-25 MG per tablet Take 1 tablet by mouth daily.   Yes Historical Provider, MD   Physical Exam: Blood pressure 189/54, pulse 58, temperature 98.2 F (36.8 C), temperature source Oral, resp. rate 16, SpO2 99.00%. Filed Vitals:   02/01/12 1146 02/01/12 1249  BP: 172/52 189/54  Pulse: 58   Temp: 98.2 F (36.8 C)   TempSrc: Oral   Resp: 16   SpO2: 99%      General:  Patient lying in bed in no distress, episode of confusion.   Eyes: PERLA, an icteric.   ENT: No tonsillar enlargement or exudate.   Neck: Supple, no JVD.   Cardiovascular: S 1, S 2 RRR, no rubs.   Respiratory: CTA.   Abdomen: BS present, soft, NT, ND.   Skin: No rashes.   Musculoskeletal: Left Lower extremity with clean dressing.   Psychiatric: At moment anxious, confused.   Neurologic: Non Focal.   Labs on Admission:  Basic Metabolic Panel: No results found for this basename: NA:5,K:5,CL:5,CO2:5,GLUCOSE:5,BUN:5,CREATININE:5,CALCIUM:5,MG:5,PHOS:5 in the last 168 hours Liver Function Tests: No results  found for this basename: AST:5,ALT:5,ALKPHOS:5,BILITOT:5,PROT:5,ALBUMIN:5 in the last 168 hours No results found for this basename: LIPASE:5,AMYLASE:5 in the last 168 hours No results found for this basename: AMMONIA:5 in the last 168 hours CBC: No results found for this basename: WBC:5,NEUTROABS:5,HGB:5,HCT:5,MCV:5,PLT:5 in the last 168 hours Cardiac Enzymes: No results found for this basename: CKTOTAL:5,CKMB:5,CKMBINDEX:5,TROPONINI:5 in the last 168 hours BNP: No components found with this basename: POCBNP:5 CBG: No results found for this basename: GLUCAP:5 in the last 168 hours  Radiological Exams on Admission: Dg Ankle 2 Views Left  02/01/2012  *RADIOLOGY REPORT*  Clinical Data: 78 year old female with left ankle fracture/subluxation - postreduction.  LEFT ANKLE - 2 VIEW  Comparison: Film earlier this day  Findings: Overlying plaster cast obscures fine detail.  There has been little interval change in the oblique fracture of the distal fibula with 4 mm lateral displacement and small fracture fragments at the medial malleolar tip. Lateral subluxation at the tibiotalar joint is noted with widening of the medial ankle mortise. No other definite  abnormalities are noted.  IMPRESSION: No significant change of bimalleolar fractures and tibiotalar subluxation.   Original Report Authenticated By: Harmon Pier, M.D.    Dg Ankle Complete Left  02/01/2012  *RADIOLOGY REPORT*  Clinical Data: Fall, left ankle swelling and ecchymosis  LEFT ANKLE COMPLETE - 3+ VIEW  Comparison: None.  Findings: There is an oblique fracture of the distal left fibula with overlying soft tissue swelling.  Approximately one half shaft width fracture fragment overlap.  There is widening of the medial aspect of the ankle mortise.  Tiny avulsion fracture fragments are noted adjacent to the medial malleolus with overlying soft tissue swelling.  No radiopaque foreign body.  IMPRESSION: Bimalleolar fracture with widening of the medial  aspect of the ankle mortise.   Original Report Authenticated By: Christiana Pellant, M.D.    Ct Head Wo Contrast  02/01/2012  *RADIOLOGY REPORT*  Clinical Data: 78 year old female with fall, altered mental status and on anticoagulants.  CT HEAD WITHOUT CONTRAST  Technique:  Contiguous axial images were obtained from the base of the skull through the vertex without contrast.  Comparison: 04/13/2009 CT  Findings: Atrophy and chronic small vessel Tarnowski matter ischemic changes are again identified.  No acute intracranial abnormalities are identified, including mass lesion or mass effect, hydrocephalus, extra-axial fluid collection, midline shift, hemorrhage, or acute infarction.  The visualized bony calvarium is unremarkable. Mild mucosal thickening / fluid within the right frontal and ethmoid sinuses is unchanged.  IMPRESSION: No evidence of acute intracranial abnormality.  Atrophy and chronic small vessel Estell matter ischemic changes.  Unchanged paranasal sinus disease/sinusitis.   Original Report Authenticated By: Harmon Pier, M.D.    Dg Chest Port 1 View  02/01/2012  *RADIOLOGY REPORT*  Clinical Data: Preop ankle fracture  PORTABLE CHEST - 1 VIEW  Comparison: 04/13/2009  Findings: Pulmonary vascular congestion with mild edema.  Mild atelectasis in the lung bases.  No significant pleural effusion.  IMPRESSION: Mild fluid overload   Original Report Authenticated By: Janeece Riggers, M.D.     EKG: Pending.   Time spent: More than 60 minutes.   Amiliana Foutz [facility] Pager [phone]  If 7PM-7AM, please contact night-coverage [email or web address] Password [email or web address] 02/01/2012, 5:20 PM

## 2012-02-01 NOTE — Anesthesia Preprocedure Evaluation (Addendum)
Anesthesia Evaluation  Patient identified by MRN, date of birth, ID band Patient awake    Reviewed: Allergy & Precautions, H&P , NPO status , Patient's Chart, lab work & pertinent test results  History of Anesthesia Complications (+) AWARENESS UNDER ANESTHESIA  Airway Mallampati: II TM Distance: >3 FB Neck ROM: Full    Dental No notable dental hx.    Pulmonary COPD breath sounds clear to auscultation  Pulmonary exam normal       Cardiovascular hypertension, Pt. on medications negative cardio ROS  + dysrhythmias Atrial Fibrillation Rhythm:Regular Rate:Normal     Neuro/Psych negative neurological ROS  negative psych ROS   GI/Hepatic negative GI ROS, Neg liver ROS, GERD-  Medicated,  Endo/Other  negative endocrine ROS  Renal/GU negative Renal ROS  negative genitourinary   Musculoskeletal negative musculoskeletal ROS (+)   Abdominal (+) + obese,   Peds negative pediatric ROS (+)  Hematology negative hematology ROS (+)   Anesthesia Other Findings   Reproductive/Obstetrics negative OB ROS                          Anesthesia Physical Anesthesia Plan  ASA: III  Anesthesia Plan: General   Post-op Pain Management:    Induction: Intravenous  Airway Management Planned: Oral ETT  Additional Equipment:   Intra-op Plan:   Post-operative Plan: Extubation in OR  Informed Consent: I have reviewed the patients History and Physical, chart, labs and discussed the procedure including the risks, benefits and alternatives for the proposed anesthesia with the patient or authorized representative who has indicated his/her understanding and acceptance.   Dental advisory given  Plan Discussed with: CRNA  Anesthesia Plan Comments:         Anesthesia Quick Evaluation

## 2012-02-02 ENCOUNTER — Inpatient Hospital Stay (HOSPITAL_COMMUNITY): Payer: Medicare Other

## 2012-02-02 DIAGNOSIS — S82899A Other fracture of unspecified lower leg, initial encounter for closed fracture: Secondary | ICD-10-CM

## 2012-02-02 LAB — GLUCOSE, CAPILLARY: Glucose-Capillary: 119 mg/dL — ABNORMAL HIGH (ref 70–99)

## 2012-02-02 MED ORDER — MORPHINE SULFATE 2 MG/ML IJ SOLN
1.0000 mg | INTRAMUSCULAR | Status: DC | PRN
  Administered 2012-02-02: 1 mg via INTRAVENOUS
  Filled 2012-02-02: qty 1

## 2012-02-02 MED ORDER — HYDROCODONE-ACETAMINOPHEN 5-325 MG PO TABS: 1.0000 | ORAL_TABLET | Freq: Four times a day (QID) | ORAL | Status: DC | PRN

## 2012-02-02 MED ORDER — GALANTAMINE HYDROBROMIDE 4 MG PO TABS
12.0000 mg | ORAL_TABLET | Freq: Two times a day (BID) | ORAL | Status: DC
  Administered 2012-02-02 – 2012-02-05 (×7): 12 mg via ORAL
  Filled 2012-02-02 (×9): qty 3

## 2012-02-02 MED ORDER — ASPIRIN 325 MG PO TABS
325.0000 mg | ORAL_TABLET | Freq: Two times a day (BID) | ORAL | Status: DC
  Administered 2012-02-02: 325 mg via ORAL
  Filled 2012-02-02 (×4): qty 1

## 2012-02-02 MED ORDER — HYDROMORPHONE HCL PF 1 MG/ML IJ SOLN
0.2500 mg | INTRAMUSCULAR | Status: DC | PRN
Start: 2012-02-02 — End: 2012-02-02

## 2012-02-02 MED ORDER — PROMETHAZINE HCL 25 MG/ML IJ SOLN: 6.2500 mg | INTRAMUSCULAR | Status: DC | PRN

## 2012-02-02 MED ORDER — CEFAZOLIN SODIUM 1-5 GM-% IV SOLN
1.0000 g | Freq: Three times a day (TID) | INTRAVENOUS | Status: AC
  Administered 2012-02-02 (×3): 1 g via INTRAVENOUS
  Filled 2012-02-02 (×3): qty 50

## 2012-02-02 MED ORDER — DABIGATRAN ETEXILATE MESYLATE 75 MG PO CAPS
75.0000 mg | ORAL_CAPSULE | Freq: Two times a day (BID) | ORAL | Status: DC
  Administered 2012-02-02 – 2012-02-05 (×7): 75 mg via ORAL
  Filled 2012-02-02 (×8): qty 1

## 2012-02-02 MED ORDER — NEOSTIGMINE METHYLSULFATE 1 MG/ML IJ SOLN
INTRAMUSCULAR | Status: DC | PRN
  Administered 2012-02-02: 5 mg via INTRAVENOUS

## 2012-02-02 MED ORDER — GLYCOPYRROLATE 0.2 MG/ML IJ SOLN
INTRAMUSCULAR | Status: DC | PRN
  Administered 2012-02-02: 0.2 mg via INTRAVENOUS
  Administered 2012-02-02: .8 mg via INTRAVENOUS

## 2012-02-02 NOTE — Progress Notes (Signed)
CSW assisting with d/c planning. [facility] is unable to re admit at this time. They will consider readmission following ST SNF placement. Pt has been faxed out and bed offers will be provided to daughter this afternoon. Daughter is aware safety sitters will be needed at SNF and is able to accommodate . Daughter will tour SNFs once list is available . CSW will follow to assist with d/c planning to SNF.  Cori Razor LCSW [phone]

## 2012-02-02 NOTE — Progress Notes (Signed)
Clinical Social Work Department CLINICAL SOCIAL WORK PLACEMENT NOTE 02/02/2012  Patient:  Hannah Bowers, Hannah Bowers  Account Number:  [id number] Admit date:  02/01/2012  Clinical Social Worker:  Cori Razor, LCSW  Date/time:  02/02/2012 02:08 PM  Clinical Social Work is seeking post-discharge placement for this patient at the following level of care:   SKILLED NURSING   (*CSW will update this form in Epic as items are completed)   02/02/2012  Patient/family provided with [facility] Department of Clinical Social Work's list of facilities offering this level of care within the geographic area requested by the patient (or if unable, by the patient's family).  02/02/2012  Patient/family informed of their freedom to choose among providers that offer the needed level of care, that participate in Medicare, Medicaid or managed care program needed by the patient, have an available bed and are willing to accept the patient.    Patient/family informed of [facility]' ownership interest in [facility], as well as of the fact that they are under no obligation to receive care at this facility.  PASARR submitted to EDS on 02/02/2012 PASARR number received from EDS on 02/02/2012  FL2 transmitted to all facilities in geographic area requested by pt/family on  02/02/2012 FL2 transmitted to all facilities within larger geographic area on   Patient informed that his/her managed care company has contracts with or will negotiate with  certain facilities, including the following:     Patient/family informed of bed offers received:   Patient chooses bed at  Physician recommends and patient chooses bed at    Patient to be transferred to  on   Patient to be transferred to facility by   The following physician request were entered in Epic:   Additional Comments:  Cori Razor LCSW [phone]

## 2012-02-02 NOTE — Anesthesia Postprocedure Evaluation (Signed)
  Anesthesia Post-op Note  Patient: Hannah Bowers  Procedure(s) Performed: Procedure(s) (LRB) with comments: OPEN REDUCTION INTERNAL FIXATION (ORIF) ANKLE FRACTURE (Left)  Patient Location: PACU  Anesthesia Type:General  Level of Consciousness: awake, alert , oriented and patient cooperative  Airway and Oxygen Therapy: Patient Spontanous Breathing and Patient connected to face mask oxygen  Post-op Pain: none  Post-op Assessment: Post-op Vital signs reviewed, Patient's Cardiovascular Status Stable, Respiratory Function Stable, Patent Airway, No signs of Nausea or vomiting and Pain level controlled  Post-op Vital Signs: Reviewed and stable  Complications: No apparent anesthesia complications

## 2012-02-02 NOTE — Progress Notes (Signed)
[facility] PROGRESS NOTE  Hannah Bowers MVH:[id number] DOB: May 26, 1930 DOA: 02/01/2012 PCP: No primary provider on file.  Assessment/Plan: 1. A fib: Patient's HR at this time is sinus Rhythm, continue with amiodaroneWill need need to resume Pradaxa post surgery when ortho consider appropriate.  2. Hypertension: I will continue with home dose Maxzide. Will need to follow B-met, to evaluate renal function. I Will order PRN Hydralazine for better blood pressure control.  3. Hypothyroidism: continue with synthroid.check TSH.  4. COPD: No evidence of acute exacerbation.  5. Diastolic Hearth Failure: Appear compensated. Chest X ray with mild fluid overload. Resume diuretic (Maxzide). 6. Left Ankle Fracture:s/p ORIF today.  7.      Consultants:  Orthopedics.  Procedures: ORIF of the ankle HPI/Subjective: NOT COMPLAINING OF PAIN . BUT CONFUSED AND RESTLESS.  Objective: Filed Vitals:   02/02/12 0257 02/02/12 0505 02/02/12 1000 02/02/12 1405  BP: 167/72 171/71 162/70 175/65  Pulse: 57 55 62 61  Temp: 97.4 F (36.3 C) 98.1 F (36.7 C) 97.9 F (36.6 C) 98.4 F (36.9 C)  TempSrc: Oral Axillary Oral Oral  Resp: 18 18 15 14   Height:      Weight:      SpO2: 92% 93% 91% 95%    Intake/Output Summary (Last 24 hours) at 02/02/12 1649 Last data filed at 02/02/12 1636  Gross per 24 hour  Intake 3246.09 ml  Output   1270 ml  Net 1976.09 ml   Filed Weights   02/01/12 1944  Weight: 94.802 kg (209 lb)    Exam:   General:  Alert , confused and trying to get out of bed  Cardiovascular: s1s2  Respiratory: CTAB  Abdomen: sfot NT ND BS+  Data Reviewed: Basic Metabolic Panel:  Lab 02/01/12 8413  NA 141  K 3.3*  CL 104  CO2 25  GLUCOSE 123*  BUN 18  CREATININE 1.00  CALCIUM 9.9  MG --  PHOS --   Liver Function Tests: No results found for this basename: AST:5,ALT:5,ALKPHOS:5,BILITOT:5,PROT:5,ALBUMIN:5 in the last 168 hours No results found for this basename:  LIPASE:5,AMYLASE:5 in the last 168 hours No results found for this basename: AMMONIA:5 in the last 168 hours CBC:  Lab 02/01/12 1736  WBC 8.4  NEUTROABS --  HGB 13.5  HCT 40.0  MCV 89.3  PLT 201   Cardiac Enzymes: No results found for this basename: CKTOTAL:5,CKMB:5,CKMBINDEX:5,TROPONINI:5 in the last 168 hours BNP (last 3 results)  Basename 02/01/12 1736  PROBNP 274.9   CBG:  Lab 02/02/12 0737  GLUCAP 119*    No results found for this or any previous visit (from the past 240 hour(s)).   Studies: Dg Ankle 2 Views Left  02/02/2012  *RADIOLOGY REPORT*  Clinical Data: Left ankle ORIF.  LEFT ANKLE - 2 VIEW  Comparison: 02/01/2012.  Findings: Two intraoperative fluoroscopic views of the left ankle demonstrate restoration of normal alignment at the ankle mortise, and postoperative changes of ORIF of the lateral malleolus of the lateral plate and screw fixation device restoring anatomic alignment.  IMPRESSION: 1.  Intraoperative documentation of ORIF of left ankle, as above.   Original Report Authenticated By: Trudie Reed, M.D.    Dg Ankle 2 Views Left  02/01/2012  *RADIOLOGY REPORT*  Clinical Data: 78 year old female with left ankle fracture/subluxation - postreduction.  LEFT ANKLE - 2 VIEW  Comparison: Film earlier this day  Findings: Overlying plaster cast obscures fine detail.  There has been little interval change in the oblique fracture of the distal fibula  with 4 mm lateral displacement and small fracture fragments at the medial malleolar tip. Lateral subluxation at the tibiotalar joint is noted with widening of the medial ankle mortise. No other definite abnormalities are noted.  IMPRESSION: No significant change of bimalleolar fractures and tibiotalar subluxation.   Original Report Authenticated By: Harmon Pier, M.D.    Dg Ankle Complete Left  02/01/2012  *RADIOLOGY REPORT*  Clinical Data: Fall, left ankle swelling and ecchymosis  LEFT ANKLE COMPLETE - 3+ VIEW  Comparison:  None.  Findings: There is an oblique fracture of the distal left fibula with overlying soft tissue swelling.  Approximately one half shaft width fracture fragment overlap.  There is widening of the medial aspect of the ankle mortise.  Tiny avulsion fracture fragments are noted adjacent to the medial malleolus with overlying soft tissue swelling.  No radiopaque foreign body.  IMPRESSION: Bimalleolar fracture with widening of the medial aspect of the ankle mortise.   Original Report Authenticated By: Christiana Pellant, M.D.    Ct Head Wo Contrast  02/01/2012  *RADIOLOGY REPORT*  Clinical Data: 78 year old female with fall, altered mental status and on anticoagulants.  CT HEAD WITHOUT CONTRAST  Technique:  Contiguous axial images were obtained from the base of the skull through the vertex without contrast.  Comparison: 04/13/2009 CT  Findings: Atrophy and chronic small vessel Thal matter ischemic changes are again identified.  No acute intracranial abnormalities are identified, including mass lesion or mass effect, hydrocephalus, extra-axial fluid collection, midline shift, hemorrhage, or acute infarction.  The visualized bony calvarium is unremarkable. Mild mucosal thickening / fluid within the right frontal and ethmoid sinuses is unchanged.  IMPRESSION: No evidence of acute intracranial abnormality.  Atrophy and chronic small vessel Ager matter ischemic changes.  Unchanged paranasal sinus disease/sinusitis.   Original Report Authenticated By: Harmon Pier, M.D.    Dg Chest Port 1 View  02/01/2012  *RADIOLOGY REPORT*  Clinical Data: Preop ankle fracture  PORTABLE CHEST - 1 VIEW  Comparison: 04/13/2009  Findings: Pulmonary vascular congestion with mild edema.  Mild atelectasis in the lung bases.  No significant pleural effusion.  IMPRESSION: Mild fluid overload   Original Report Authenticated By: Janeece Riggers, M.D.    Dg C-arm 1-60 Min-no Report  02/02/2012  CLINICAL DATA: surgery   C-ARM 1-60 MINUTES   Fluoroscopy was utilized by the requesting physician.  No radiographic  interpretation.      Scheduled Meds:   . ALPRAZolam  0.5 mg Oral BID  . amiodarone  200 mg Oral Daily  .  ceFAZolin (ANCEF) IV  1 g Intravenous Q8H  . dabigatran  75 mg Oral Q12H  . fluticasone  2 spray Each Nare Daily  . galantamine  12 mg Oral BID WC  . levothyroxine  100 mcg Oral QAC breakfast  . pantoprazole  40 mg Oral Daily  . triamterene-hydrochlorothiazide  1 each Oral Daily  . [DISCONTINUED] aspirin  325 mg Oral BID  . [DISCONTINUED] cholecalciferol  400 Units Oral Daily  . [DISCONTINUED] galantamine  24 mg Oral Q breakfast  . [DISCONTINUED] triamterene-hydrochlorothiazide  1 each Oral Daily   Continuous Infusions:   Active Problems:  HYPERTENSION  Atrial fibrillation  Closed left ankle fracture  Diastolic heart failure     Jordon Kristiansen  [facility] Pager [phone]. If 8PM-8AM, please contact night-coverage at [email or web address], password [facility] 02/02/2012, 4:49 PM  LOS: 1 day

## 2012-02-02 NOTE — Care Management Note (Signed)
    Page 1 of 1   02/02/2012     12:42:20 PM   CARE MANAGEMENT NOTE 02/02/2012  Patient:  [facility]   Account Number:  [id number]  Date Initiated:  02/02/2012  Documentation initiated by:  Lorenda Ishihara  Subjective/Objective Assessment:   78 yo female admitted with ankle fracture s/p fall requiring ORIF. PTA lived at [facility].     Action/Plan:   Return to ALF if able to manage care post op, otherwise SNF   Anticipated DC Date:  02/02/2012   Anticipated DC Plan:  ASSISTED LIVING / REST HOME  In-house referral  Clinical Social Worker      DC Planning Services  CM consult      Choice offered to / List presented to:             Status of service:  Completed, signed off Medicare Important Message given?   (If response is "NO", the following Medicare IM given date fields will be blank) Date Medicare IM given:   Date Additional Medicare IM given:    Discharge Disposition:  ASSISTED LIVING  Per UR Regulation:  Reviewed for med. necessity/level of care/duration of stay  If discussed at Long Length of Stay Meetings, dates discussed:    Comments:

## 2012-02-02 NOTE — Progress Notes (Signed)
Patient sitting at edge of bed.  Minimal pain.  BP 171/71  Pulse 55  Temp 98.1 F (36.7 C) (Axillary)  Resp 18  Ht 5\' 7"  (1.702 m)  Wt 94.802 kg (209 lb)  BMI 32.73 kg/m2  SpO2 93%  CAM boot well applied Cap refill < 2 sec at toes  POD #1 after ORIF left ankle fracture  - NWB left LE - OK to d/c to most appropriate facility when bed available - f/u my office in 2 weeks - will restart pradaxa and d/c foley

## 2012-02-02 NOTE — Op Note (Signed)
Hannah Bowers, Hannah Bowers NO.:  [id number]  MEDICAL RECORD NO.:  [id number]  LOCATION:  1611                         FACILITY:  [facility]  PHYSICIAN:  Estill Bamberg, MD      DATE OF BIRTH:  09/09/1930  DATE OF PROCEDURE:  02/01/2012 DATE OF DISCHARGE:                              OPERATIVE REPORT   PREOPERATIVE DIAGNOSES: 1. Left lateral malleolar. 2. Left lateral malleolus ankle fracture with widening of the ankle     mortise.  POSTOPERATIVE DIAGNOSES: 1. Left lateral malleolar. 2. Left lateral malleolus ankle fracture with widening of the ankle     mortise.  PROCEDURE: 1. Open reduction and internal fixation of the left lateral malleolar     fracture. 2. Open reduction of left ankle syndesmosis. 3. Intraoperative use of fluoroscopy.  SURGEON:  Estill Bamberg, MD  ASSISTANT:  Jason Coop, [facility]  ANESTHESIA:  General endotracheal anesthesia.  COMPLICATIONS:  None.  DISPOSITION:  Stable.  ESTIMATED BLOOD LOSS:  Minimal.  INDICATIONS FOR PROCEDURE:  Briefly, Ms. Slifka is a very pleasant 78- year-old female, who earlier today had an unwitnessed fall at her assisted living facility.  She presents to [facility] Emergency Department with left ankle swelling and pain.  Radiographs were diagnostic for a dislocated left ankle joint and a lateral malleolar fracture.  A closed reduction was attempted by the emergency room physician, however, this was unsuccessful.  The patient continued to have widening of her ankle mortise.  I therefore at this point, did have a discussion with the patient's daughter, her power-of-attorney.  I did explain to the daughter that her ankle injury was in unstable situation and surgery was required.  The patient does have a history of significant dementia in the form of Alzheimer's disease and per the daughter, the patient would likely be putting weight on the ankle. Therefore, I did feel that urgent operative fixation of her  fracture would be most appropriate.  I did ultimately evaluate the patient.  I did discuss this with the patient and her daughter.  I did explain the risks of infection, neurovascular injury, as well as skin breakdown, nonunion, posttraumatic arthritis, and failure of the hardware.  OPERATIVE DETAILS:  On February 01, 2012, the patient was brought to the surgery and general endotracheal anesthesia was administered.  Patient was placed supine on a well-padded hospital bed.  Bump was placed onto the patient's left buttock.  The left ankle was prepped and draped in usual sterile fashion.  Antibiotics were given.  Time-out procedure was performed.  I then made an incision at the posterior border of the lateral malleolus.  The hematoma about the ankle was readily identified. The pronator musculature was bluntly swept posteriorly and the fracture was a prostate exposed.  I did pull traction and internally rotate the ankle, which did provide anatomic reduction of the ankle.  At this point, I did place a 3.5 mm posterior to anterior lag screw across the fracture.  This did provide excellent provisional fixation of the fracture.  I then chose an 8 hole one-third tubular plate which was placed over the lateral aspect of the ankle.  A series of 7 screwss  were placed through the plate.  The proximal 6 screws were cortical screws and the distal 2 screws were cancellous screws.  I did liberally obtained multiple fluoroscopic views to confirm that there was no violation of the ankle joint.  I did then get AP and lateral fluoroscopic views, which did confirm excellent reduction of the patient's ankle mortise.  At this point, the wound was copiously irrigated.  The fascia was closed using 0 Vicryl.  The subcutaneous layer was then closed using 2-0 and the skin was closed using 2-0 nylon. Of note, the distal aspect of the patient's skin was tenuous, and I did use meticulous care in order to ensure that  there was no undue excessive tension on the skin.  Xeroform followed by 4x4s, Kerlix, and an Ace bandage was then applied.  Cam boot was then placed.  The patient was then awoken from general endotracheal anesthesia and transferred to recovery in stable condition.  Jason Coop was my assistant throughout the surgery and aided in retraction and suctioning     Estill Bamberg, MD     MD/MEDQ  D:  02/02/2012  T:  02/02/2012  Job:  [id number]

## 2012-02-02 NOTE — Evaluation (Signed)
Physical Therapy Evaluation Patient Details Name: Hannah Bowers MRN: [id number] DOB: 1930-06-15 Today's Date: 02/02/2012 Time: 3086-5784 PT Time Calculation (min): 26 min  PT Assessment / Plan / Recommendation Clinical Impression  78 yo female with dementia who underwent ORIF to fractured L ankle and is to be strict NWB.  Pt was unable to perform transfers today and became easitly agitated with repeated attempts.  Anticipate pt will be limited by dementia and and NWB status.  She will need 24/7 care and continued PT at discharge.  If [facility] is unable to provide this level for patient, she may need SNF .  Recommend mechanical lift transfers for patient with nursing staff if she will alllow iit.    PT Assessment  Patient needs continued PT services    Follow Up Recommendations  SNF;Home health PT (HH at ALF)    Does the patient have the potential to tolerate intense rehabilitation      Barriers to Discharge   question is ALF is able to provide level of care that patient needs    Equipment Recommendations  Other (comment) (to be determined by ALF or SNF)    Recommendations for Other Services     Frequency Min 3X/week    Precautions / Restrictions Precautions Precautions: Fall Restrictions Weight Bearing Restrictions: Yes LLE Weight Bearing: Non weight bearing Other Position/Activity Restrictions: strict NWB and applicationof CAM boot at all times   Pertinent Vitals/Pain Pt with increasing pain as foot was in dependent position      Mobility  Bed Mobility Bed Mobility: Supine to Sit;Sit to Supine Supine to Sit: 3: Mod assist;With rails;HOB elevated Sit to Supine: 1: +2 Total assist Sit to Supine: Patient Percentage: 50% Details for Bed Mobility Assistance: assist at shoulder and to bring legs up onto bed Transfers Transfers: Not assessed Details for Transfer Assistance: attempted to do squat pivot transfer to [facility] for urination but pt unable to perform transfer  with multiple attempts and 2 person assist.  She appeared to become increasingly agitiated as leg was in dependent position Ambulation/Gait Ambulation/Gait Assistance: Not tested (comment) Stairs: No Wheelchair Mobility Wheelchair Mobility: No    Shoulder Instructions     Exercises     PT Diagnosis: Acute pain;Other (comment);Difficulty walking (NWB)  PT Problem List: Pain;Decreased activity tolerance;Decreased knowledge of precautions;Decreased safety awareness;Other (comment) (decreased weight bearing.) PT Treatment Interventions: Functional mobility training;Therapeutic activities;Patient/family education   PT Goals Acute Rehab PT Goals PT Goal Formulation: Patient unable to participate in goal setting Time For Goal Achievement: 02/16/12 Potential to Achieve Goals: Fair Pt will go Supine/Side to Sit: with supervision PT Goal: Supine/Side to Sit - Progress: Goal set today Pt will Sit at Edge of Bed: Independently;6-10 min PT Goal: Sit at Edge Of Bed - Progress: Goal set today Pt will go Sit to Supine/Side: with supervision PT Goal: Sit to Supine/Side - Progress: Goal set today Pt will Transfer Bed to Chair/Chair to Bed: with min assist PT Transfer Goal: Bed to Chair/Chair to Bed - Progress: Goal set today  Visit Information  Last PT Received On: 02/02/12 Assistance Needed: +2 Reason Eval/Treat Not Completed: Pain limiting ability to participate;Other (comment) (pt unable to follow commands)    Subjective Data  Subjective: pt with random comments Patient Stated Goal: none stated   Prior Functioning  Home Living Type of Home: Assisted living Additional Comments: pt unable to give information about prior functional status Communication Communication: No difficulties;Other (comment) (unsure if patient understands)    Cognition  Overall Cognitive Status: Impaired Area of Impairment: Following commands;Memory;Safety/judgement;Awareness of errors;Awareness of  deficits;Problem solving Arousal/Alertness: Awake/alert (falls alseep easily) Orientation Level: Disoriented X4 Behavior During Session: Other (comment) (easily becomes agitated) Memory: Decreased recall of precautions Memory Deficits: unable to remember NWB, but does indicate she has pain in her LLE  Following Commands: Other (comment) Safety/Judgement - Other Comments: unable to follow commands    Extremity/Trunk Assessment Right Lower Extremity Assessment RLE ROM/Strength/Tone: [facility] for tasks assessed Left Lower Extremity Assessment LLE ROM/Strength/Tone: Deficits LLE ROM/Strength/Tone Deficits: LLE in CAM boot with tape around it to deter pt from taking it off   Balance Balance Balance Assessed: Yes Static Sitting Balance Static Sitting - Balance Support: No upper extremity supported Static Sitting - Level of Assistance: 5: Stand by assistance Static Sitting - Comment/# of Minutes: > 5 minutes  End of Session PT - End of Session Activity Tolerance: Patient limited by pain;Treatment limited secondary to agitation Patient left: in bed;with nursing in room;Other (comment) (with LLE in elevation)  GP     Donnetta Hail 02/02/2012, 1:52 PM

## 2012-02-02 NOTE — Transfer of Care (Signed)
Immediate Anesthesia Transfer of Care Note  Patient: Hannah Bowers  Procedure(s) Performed: Procedure(s) (LRB) with comments: OPEN REDUCTION INTERNAL FIXATION (ORIF) ANKLE FRACTURE (Left)  Patient Location: PACU  Anesthesia Type:General  Level of Consciousness: awake, alert , oriented and patient cooperative  Airway & Oxygen Therapy: Patient Spontanous Breathing and Patient connected to face mask oxygen  Post-op Assessment: Report given to PACU RN, Post -op Vital signs reviewed and stable and Patient moving all extremities  Post vital signs: Reviewed and stable  Complications: No apparent anesthesia complications

## 2012-02-02 NOTE — Progress Notes (Signed)
Clinical Social Work Department BRIEF PSYCHOSOCIAL ASSESSMENT 02/02/2012  Patient:  ASYRIA, KOLANDER     Account Number:  [id number]     Admit date:  02/01/2012  Clinical Social Worker:  Skip Mayer  Date/Time:  02/02/2012 08:56 AM  Referred by:  Physician  Date Referred:  02/01/2012 Referred for  Psychosocial assessment   Other Referral:   Interview type:  Family Other interview type:    PSYCHOSOCIAL DATA Living Status:  FACILITY Admitted from facility:  [facility] Level of care:  Assisted Living Primary support name:  Beverley Fiedler Primary support relationship to patient:  CHILD, ADULT Degree of support available:   supportive    CURRENT CONCERNS Current Concerns  Post-Acute Placement   Other Concerns:    SOCIAL WORK ASSESSMENT / PLAN Pt is a 78 yr old female admitted from [facility]F. CSW met with pt's daughter, [name], this am to assist with d/c planning. CSW has left a message at [facility] to see if pt  can return to them today. Awaiting return call. PT eval is pending. Family would like pt to return to [facility] , if possible. CSW will follow to assist with d/c planning needs.   Assessment/plan status:  Psychosocial Support/Ongoing Assessment of Needs Other assessment/ plan:   SNF may be needed if [facility] is unable to meet pt's needs.   Information/referral to community resources:   None at this time.    PATIENT'S/FAMILY'S RESPONSE TO PLAN OF CARE: Family would like pt to return to [facility], if possible.   Cori Razor LCSW  [phone]

## 2012-02-03 LAB — GLUCOSE, CAPILLARY
Glucose-Capillary: 117 mg/dL — ABNORMAL HIGH (ref 70–99)
Glucose-Capillary: 116 mg/dL — ABNORMAL HIGH (ref 70–99)

## 2012-02-03 NOTE — Progress Notes (Signed)
Patient laying comfortable in bed.  BP 166/77  Pulse 65  Temp 98.1 F (36.7 C) (Oral)  Resp 24  Ht 5\' 7"  (1.702 m)  Wt 94.802 kg (209 lb)  BMI 32.73 kg/m2  SpO2 91%  CAM boot well applied  Cap refill < 2 sec at toes, toes warm  POD #2 after ORIF left ankle fracture  - NWB left LE  - per social worker, plan is to d/c to SNF on Monday - daughter [name] is currently researching SNF options - f/u my office in 2 weeks  - continue pradax - PT (NWB on left)

## 2012-02-03 NOTE — Plan of Care (Signed)
Problem: Phase II Progression Outcomes Goal: Progress activity as tolerated unless otherwise ordered Outcome: Progressing Up with assist, noncompliant with WB status

## 2012-02-03 NOTE — Progress Notes (Signed)
Pt OOB to BSC x 3 with NT pivoting best w/o RW while NWB L LE.  Pt just got back into bed and requested to rest. Will attempt tomorrow. Felecia Shelling  PTA [facility]  Acute  Rehab Pager     [phone]

## 2012-02-03 NOTE — Progress Notes (Signed)
[facility] PROGRESS NOTE  Hannah BIERNAT AVW:[id number] DOB: October 11, 1930 DOA: 02/01/2012 PCP: No primary provider on file.  Assessment/Plan: 1. A fib: Patient's HR at this time is sinus Rhythm, continue with amiodarone. Continue with pradaxa 2. Hypertension: I will continue with home dose Maxzide. I Will order PRN Hydralazine for better blood pressure control.  3. Hypothyroidism: continue with synthroid. 4. COPD: No evidence of acute exacerbation.  5. Diastolic Hearth Failure: Appear compensated. Chest X ray with mild fluid overload. Resume diuretic (Maxzide). 6. Left Ankle Fracture:s/p ORIF and discharge on Monday as per ortho. 7.      Consultants:  Orthopedics.  Procedures: ORIF of the ankle HPI/Subjective: NOT COMPLAINING OF PAIN . BUT CONFUSED AND RESTLESS.  Objective: Filed Vitals:   02/02/12 1405 02/02/12 2155 02/03/12 0648 02/03/12 1445  BP: 175/65 171/65 166/77 141/56  Pulse: 61 67 65 70  Temp: 98.4 F (36.9 C) 98 F (36.7 C) 98.1 F (36.7 C) 98.9 F (37.2 C)  TempSrc: Oral   Oral  Resp: 14 22 24 22   Height:      Weight:      SpO2: 95% 82% 91% 94%    Intake/Output Summary (Last 24 hours) at 02/03/12 2107 Last data filed at 02/03/12 1946  Gross per 24 hour  Intake    780 ml  Output   2000 ml  Net  -1220 ml   Filed Weights   02/01/12 1944  Weight: 94.802 kg (209 lb)    Exam:   General:  Alert , confused and trying to get out of bed  Cardiovascular: s1s2  Respiratory: CTAB  Abdomen: sfot NT ND BS+  Data Reviewed: Basic Metabolic Panel:  Lab 02/01/12 8295  NA 141  K 3.3*  CL 104  CO2 25  GLUCOSE 123*  BUN 18  CREATININE 1.00  CALCIUM 9.9  MG --  PHOS --   Liver Function Tests: No results found for this basename: AST:5,ALT:5,ALKPHOS:5,BILITOT:5,PROT:5,ALBUMIN:5 in the last 168 hours No results found for this basename: LIPASE:5,AMYLASE:5 in the last 168 hours No results found for this basename: AMMONIA:5 in the last 168  hours CBC:  Lab 02/01/12 1736  WBC 8.4  NEUTROABS --  HGB 13.5  HCT 40.0  MCV 89.3  PLT 201   Cardiac Enzymes: No results found for this basename: CKTOTAL:5,CKMB:5,CKMBINDEX:5,TROPONINI:5 in the last 168 hours BNP (last 3 results)  Basename 02/01/12 1736  PROBNP 274.9   CBG:  Lab 02/03/12 1643 02/03/12 1111 02/03/12 0726 02/02/12 0737  GLUCAP 117* 116* 117* 119*    No results found for this or any previous visit (from the past 240 hour(s)).   Studies: Dg Ankle 2 Views Left  02/02/2012  *RADIOLOGY REPORT*  Clinical Data: Left ankle ORIF.  LEFT ANKLE - 2 VIEW  Comparison: 02/01/2012.  Findings: Two intraoperative fluoroscopic views of the left ankle demonstrate restoration of normal alignment at the ankle mortise, and postoperative changes of ORIF of the lateral malleolus of the lateral plate and screw fixation device restoring anatomic alignment.  IMPRESSION: 1.  Intraoperative documentation of ORIF of left ankle, as above.   Original Report Authenticated By: Trudie Reed, M.D.    Dg C-arm 1-60 Min-no Report  02/02/2012  CLINICAL DATA: surgery   C-ARM 1-60 MINUTES  Fluoroscopy was utilized by the requesting physician.  No radiographic  interpretation.      Scheduled Meds:    . ALPRAZolam  0.5 mg Oral BID  . amiodarone  200 mg Oral Daily  . [COMPLETED]  ceFAZolin (ANCEF) IV  1 g Intravenous Q8H  . dabigatran  75 mg Oral Q12H  . fluticasone  2 spray Each Nare Daily  . galantamine  12 mg Oral BID WC  . levothyroxine  100 mcg Oral QAC breakfast  . pantoprazole  40 mg Oral Daily  . triamterene-hydrochlorothiazide  1 each Oral Daily   Continuous Infusions:   Active Problems:  HYPERTENSION  Atrial fibrillation  Closed left ankle fracture  Diastolic heart failure     Hannah Bowers  [facility] Pager [phone]. If 8PM-8AM, please contact night-coverage at [email or web address], password [facility] 02/03/2012, 9:07 PM  LOS: 2 days

## 2012-02-03 NOTE — Plan of Care (Signed)
Problem: Phase I Progression Outcomes Goal: Initial discharge plan identified Pt will not be able to return to [facility] due to level of care.  CM working on placement.

## 2012-02-04 LAB — CBC
MCH: 30.8 pg (ref 26.0–34.0)
MCV: 89 fL (ref 78.0–100.0)
Platelets: 165 10*3/uL (ref 150–400)
RDW: 14.3 % (ref 11.5–15.5)
WBC: 6.7 10*3/uL (ref 4.0–10.5)

## 2012-02-04 LAB — BASIC METABOLIC PANEL
Calcium: 8.9 mg/dL (ref 8.4–10.5)
Creatinine, Ser: 0.88 mg/dL (ref 0.50–1.10)
GFR calc Af Amer: 71 mL/min — ABNORMAL LOW (ref >90)

## 2012-02-04 NOTE — Progress Notes (Signed)
Patient laying comfortable in bed reports no pain, has not had any pain medication today per nurse.  BP 159/85  Pulse 63  Temp 98.2 F (36.8 C) (Oral)  Resp 18  Ht 5\' 7"  (1.702 m)  Wt 94.802 kg (209 lb)  BMI 32.73 kg/m2  SpO2 95%   CAM boot well applied however covering tape has been removed, nursing states it has not been present since they began their shift, unsure of who removed. Cap refill < 2 sec at toes, toes warm, mild swelling of L knee, no laxity, no TTP, no px per pt  POD #3 after ORIF left ankle fracture  - NWB left LE  - per social worker, plan is to d/c to SNF on Monday - daughter [name] is currently researching SNF options  - f/u my office in 2 weeks  - continue pradaxa  - PT (NWB on left) -Maintain tape over CAM boot AT ALL TIMES (prevent pt from removing) -CAM boot is not to be removed -mild swelling L knee, likely 2nd to elevation of ankle, no acute findings or px on exam

## 2012-02-04 NOTE — Progress Notes (Signed)
[facility] PROGRESS NOTE  Hannah Bowers:[id number] DOB: 1930/08/28 DOA: 02/01/2012 PCP: No primary provider on file.  Assessment/Plan: 1. A fib: Patient's HR at this time is sinus Rhythm, continue with amiodarone. Continue with pradaxa 2. Hypertension: slightly elevated on maxide, possibly from pain.  Will order PRN Hydralazine for better blood pressure control.  3. Hypothyroidism: continue with synthroid. 4. COPD: No evidence of acute exacerbation.  5. Diastolic Hearth Failure: Appear compensated. Chest X ray with mild fluid overload. Resume diuretic (Maxzide). 6. Left Ankle Fracture:s/p ORIF and discharge on Monday as per ortho. 7.      Consultants:  Orthopedics.  Procedures: ORIF of the ankle HPI/Subjective: NOT COMPLAINING OF PAIN . BUT CONFUSED AND RESTLESS.  Objective: Filed Vitals:   02/03/12 1445 02/04/12 0500 02/04/12 1011 02/04/12 1432  BP: 141/56 167/74 159/85 157/75  Pulse: 70 57 63 65  Temp: 98.9 F (37.2 C) 97.2 F (36.2 C) 98.2 F (36.8 C) 97.4 F (36.3 C)  TempSrc: Oral Oral Oral Oral  Resp: 22 20 18 20   Height:      Weight:      SpO2: 94% 91% 95% 94%    Intake/Output Summary (Last 24 hours) at 02/04/12 1737 Last data filed at 02/04/12 1202  Gross per 24 hour  Intake    480 ml  Output   2275 ml  Net  -1795 ml   Filed Weights   02/01/12 1944  Weight: 94.802 kg (209 lb)    Exam:   General:  Alert , confused and trying to get out of bed  Cardiovascular: s1s2  Respiratory: CTAB  Abdomen: sfot NT ND BS+  Data Reviewed: Basic Metabolic Panel:  Lab 02/04/12 8119 02/01/12 1736  NA 133* 141  K 3.4* 3.3*  CL 95* 104  CO2 31 25  GLUCOSE 104* 123*  BUN 15 18  CREATININE 0.88 1.00  CALCIUM 8.9 9.9  MG -- --  PHOS -- --   Liver Function Tests: No results found for this basename: AST:5,ALT:5,ALKPHOS:5,BILITOT:5,PROT:5,ALBUMIN:5 in the last 168 hours No results found for this basename: LIPASE:5,AMYLASE:5 in the last 168  hours No results found for this basename: AMMONIA:5 in the last 168 hours CBC:  Lab 02/04/12 0506 02/01/12 1736  WBC 6.7 8.4  NEUTROABS -- --  HGB 12.3 13.5  HCT 35.5* 40.0  MCV 89.0 89.3  PLT 165 201   Cardiac Enzymes: No results found for this basename: CKTOTAL:5,CKMB:5,CKMBINDEX:5,TROPONINI:5 in the last 168 hours BNP (last 3 results)  Basename 02/01/12 1736  PROBNP 274.9   CBG:  Lab 02/03/12 1643 02/03/12 1111 02/03/12 0726 02/02/12 0737  GLUCAP 117* 116* 117* 119*    No results found for this or any previous visit (from the past 240 hour(s)).   Studies: No results found.  Scheduled Meds:    . ALPRAZolam  0.5 mg Oral BID  . amiodarone  200 mg Oral Daily  . dabigatran  75 mg Oral Q12H  . fluticasone  2 spray Each Nare Daily  . galantamine  12 mg Oral BID WC  . levothyroxine  100 mcg Oral QAC breakfast  . pantoprazole  40 mg Oral Daily  . triamterene-hydrochlorothiazide  1 each Oral Daily   Continuous Infusions:   Active Problems:  HYPERTENSION  Atrial fibrillation  Closed left ankle fracture  Diastolic heart failure     Jamale Spangler  [facility] Pager [phone]. If 8PM-8AM, please contact night-coverage at [email or web address], password [facility] 02/04/2012, 5:37 PM  LOS: 3 days

## 2012-02-05 LAB — GLUCOSE, CAPILLARY: Glucose-Capillary: 108 mg/dL — ABNORMAL HIGH (ref 70–99)

## 2012-02-05 LAB — BASIC METABOLIC PANEL
Calcium: 9.3 mg/dL (ref 8.4–10.5)
GFR calc Af Amer: 77 mL/min — ABNORMAL LOW (ref >90)
GFR calc non Af Amer: 66 mL/min — ABNORMAL LOW (ref >90)
Sodium: 135 mEq/L (ref 135–145)

## 2012-02-05 NOTE — Progress Notes (Signed)
Patient laying comfortable in bed reports no pain..   BP 168/71  Pulse 57  Temp 98.1 F (36.7 C) (Oral)  Resp 24  Ht 5\' 7"  (1.702 m)  Wt 94.802 kg (209 lb)  BMI 32.73 kg/m2  SpO2 94%  CAM boot well applied and tape still in place Cap refill < 2 sec at toes, toes warm, mild swelling of L knee improved from yesterday, no laxity, no TTP, no px per pt   POD #4 after ORIF left ankle fracture  - NWB left LE  - d/c today to ALF with observation - daughter [name] is planning on [facility]  - f/u my office in 2 weeks  - continue pradaxa  - PT (NWB on left)  -Maintain tape over CAM boot AT ALL TIMES (prevent pt from removing)  -CAM boot is not to be removed  -mild swelling L knee, likely 2nd to elevation of ankle, no acute findings or px on exam D/c on pradaxa and Norco 5mg 

## 2012-02-05 NOTE — Progress Notes (Signed)
   02/05/12 0900  PT Visit Information  Last PT Received On 02/05/12  Reason Eval/Treat Not Completed (pt refused)  Pt has been getting to [facility] with staff and doing well per NT   OTES:[id number]}

## 2012-02-05 NOTE — Progress Notes (Signed)
Discharge summary sent to payer through [facility]  

## 2012-02-05 NOTE — Discharge Summary (Signed)
Physician Discharge Summary  Patient ID: Hannah Bowers MRN: [id number] DOB/AGE: September 28, 1930 78 y.o.  Admit date: 02/01/2012 Discharge date: 02/05/2012  Admission Diagnoses: Acute L Lat malleolus/distal fibular fx  Discharge Diagnoses:  Active Problems:  HYPERTENSION  Atrial fibrillation  Closed left ankle fracture  Diastolic heart failure   Discharged Condition: improved s/p ORIF L ankle  Hospital Course: ORIF L ankle 4 days ago, on 16th floor for 4 days uneventful, d/c to ALF  Consults: Hospitalist  Discharge Exam: Blood pressure 168/71, pulse 57, temperature 98.1 F (36.7 C), temperature source Oral, resp. rate 24, height 5\' 7"  (1.702 m), weight 94.802 kg (209 lb), SpO2 94.00%. CAM boot well applied, tape covering appropriately, good cap refill BIL, mild swelling L knee,   Disposition: ALF with 24hr observation    Medication List     As of 02/05/2012  8:57 AM    TAKE these medications         acetaminophen 325 MG tablet   Commonly known as: TYLENOL   Take 650 mg by mouth every 6 (six) hours as needed. UAD      ALPRAZolam 0.5 MG tablet   Commonly known as: XANAX   Take 0.5 mg by mouth 2 (two) times daily.      amiodarone 200 MG tablet   Commonly known as: PACERONE   Take 200 mg by mouth daily.      cholecalciferol 400 UNITS Tabs   Commonly known as: VITAMIN D   Take 400 Units by mouth daily.      dabigatran 75 MG Caps   Commonly known as: PRADAXA   Take 75 mg by mouth 2 (two) times daily.      FLONASE 50 MCG/ACT nasal spray   Generic drug: fluticasone   Place 2 sprays into the nose daily.      galantamine 24 MG 24 hr capsule   Commonly known as: RAZADYNE ER   Take 24 mg by mouth daily with breakfast.      HYDROcodone-acetaminophen 5-325 MG per tablet   Commonly known as: NORCO/VICODIN   Take 1 tablet by mouth every 6 (six) hours as needed.      levothyroxine 75 MCG tablet   Commonly known as: SYNTHROID, LEVOTHROID   Take by mouth daily. Take 1/2  tablet      levothyroxine 100 MCG tablet   Commonly known as: SYNTHROID, LEVOTHROID   Take 100 mcg by mouth daily.      loratadine 10 MG tablet   Commonly known as: CLARITIN   Take 10 mg by mouth daily.      nitroGLYCERIN 0.4 MG SL tablet   Commonly known as: NITROSTAT   Place 0.4 mg under the tongue every 5 (five) minutes as needed.      pantoprazole 40 MG tablet   Commonly known as: PROTONIX   Take 40 mg by mouth daily.      potassium chloride 10 MEQ CR tablet   Commonly known as: KLOR-CON   Take 10 mEq by mouth daily.      triamcinolone cream 0.1 %   Commonly known as: KENALOG   Apply 1 application topically 2 (two) times daily.      triamterene-hydrochlorothiazide 37.5-25 MG per tablet   Commonly known as: MAXZIDE-25   Take 1 tablet by mouth daily.      d/c prescription Norco 5mg , cont pradaxa and home meds   Signed: Veneda Melter 02/05/2012, 8:57 AM

## 2012-02-06 NOTE — Progress Notes (Signed)
Clinical Social Work Department CLINICAL SOCIAL WORK PLACEMENT NOTE 02/06/2012  Patient:  Hannah Bowers, Hannah Bowers  Account Number:  [id number] Admit date:  02/01/2012  Clinical Social Worker:  Cori Razor, LCSW  Date/time:  02/02/2012 02:08 PM  Clinical Social Work is seeking post-discharge placement for this patient at the following level of care:   SKILLED NURSING   (*CSW will update this form in Epic as items are completed)   02/02/2012  Patient/family provided with [facility] Department of Clinical Social Work's list of facilities offering this level of care within the geographic area requested by the patient (or if unable, by the patient's family).  02/02/2012  Patient/family informed of their freedom to choose among providers that offer the needed level of care, that participate in Medicare, Medicaid or managed care program needed by the patient, have an available bed and are willing to accept the patient.    Patient/family informed of [facility]' ownership interest in [facility], as well as of the fact that they are under no obligation to receive care at this facility.  PASARR submitted to EDS on 02/02/2012 PASARR number received from [employer] on 02/02/2012  FL2 transmitted to all facilities in geographic area requested by pt/family on  02/02/2012 FL2 transmitted to all facilities within larger geographic area on   Patient informed that his/her managed care company has contracts with or will negotiate with  certain facilities, including the following:     Patient/family informed of bed offers received:  01/05/2012 Patient chooses bed at [facility] Physician recommends and patient chooses bed at    Patient to be [facility] to [facility] on  02/05/2012 Patient to be transferred to facility by [facility]  The following physician request were entered in Epic:   Additional Comments:  Cori Razor LCSW [phone]

## 2012-02-07 ENCOUNTER — Encounter (HOSPITAL_COMMUNITY): Payer: Self-pay | Admitting: Orthopedic Surgery

## 2012-05-01 ENCOUNTER — Emergency Department (HOSPITAL_COMMUNITY)
Admission: EM | Admit: 2012-05-01 | Discharge: 2012-05-02 | Disposition: A | Discharge status: Home / Self Care | Payer: Medicare Other | Attending: Emergency Medicine | Admitting: Emergency Medicine

## 2012-05-01 ENCOUNTER — Encounter (HOSPITAL_COMMUNITY): Payer: Self-pay | Admitting: *Deleted

## 2012-05-01 ENCOUNTER — Emergency Department (HOSPITAL_COMMUNITY): Payer: Medicare Other

## 2012-05-01 DIAGNOSIS — F0390 Unspecified dementia without behavioral disturbance: Secondary | ICD-10-CM | POA: Insufficient documentation

## 2012-05-01 DIAGNOSIS — Z8679 Personal history of other diseases of the circulatory system: Secondary | ICD-10-CM | POA: Insufficient documentation

## 2012-05-01 DIAGNOSIS — Z87891 Personal history of nicotine dependence: Secondary | ICD-10-CM | POA: Insufficient documentation

## 2012-05-01 DIAGNOSIS — Z79899 Other long term (current) drug therapy: Secondary | ICD-10-CM | POA: Insufficient documentation

## 2012-05-01 DIAGNOSIS — R1013 Epigastric pain: Secondary | ICD-10-CM | POA: Insufficient documentation

## 2012-05-01 DIAGNOSIS — K219 Gastro-esophageal reflux disease without esophagitis: Secondary | ICD-10-CM | POA: Insufficient documentation

## 2012-05-01 DIAGNOSIS — J449 Chronic obstructive pulmonary disease, unspecified: Secondary | ICD-10-CM | POA: Insufficient documentation

## 2012-05-01 DIAGNOSIS — R079 Chest pain, unspecified: Secondary | ICD-10-CM | POA: Insufficient documentation

## 2012-05-01 DIAGNOSIS — J4489 Other specified chronic obstructive pulmonary disease: Secondary | ICD-10-CM | POA: Insufficient documentation

## 2012-05-01 DIAGNOSIS — I10 Essential (primary) hypertension: Secondary | ICD-10-CM | POA: Insufficient documentation

## 2012-05-01 NOTE — ED Notes (Addendum)
PER ems 3-4 min of intense CP at nursing home. Pt states that the pain is gone and she has not had pain since. Pt burping currently.

## 2012-05-01 NOTE — ED Provider Notes (Addendum)
History     CSN: [id number]  Arrival date & time 05/01/12  2231   First MD Initiated Contact with Patient 05/01/12 2306      Chief Complaint  Patient presents with  . Chest Pain    (Consider location/radiation/quality/duration/timing/severity/associated sxs/prior treatment) Patient is a 79 y.o. female presenting with chest pain. The history is provided by the patient and a relative.  Chest Pain pt here with epigastric pain that radiated to her chest--h/o gerd and this is the same--pt burped and feels better--denies anginal type chest pain, --no recent cough or diaphoresis, no fever--feels back to her baseline--sx resolved spontaneously  Past Medical History  Diagnosis Date  . Hypertension   . Atrial flutter     paroxysmal  . GERD (gastroesophageal reflux disease)   . Dementia   . COPD (chronic obstructive pulmonary disease)     Past Surgical History  Procedure Laterality Date  . Radiofrequency ablation  2001  . Orif ankle fracture  02/01/2012    Procedure: OPEN REDUCTION INTERNAL FIXATION (ORIF) ANKLE FRACTURE;  Surgeon: Emilee Hero, MD;  Location: [facility];  Service: Orthopedics;  Laterality: Left;    Family History  Problem Relation Age of Onset  . Alzheimer's disease Father     dementia  . Other Mother     possible MS    History  Substance Use Topics  . Smoking status: Former Smoker -- 0.25 packs/day for 4 years    Quit date: 01/31/1978  . Smokeless tobacco: Never Used  . Alcohol Use: No    OB History   Grav Para Term Preterm Abortions TAB SAB Ect Mult Living                  Review of Systems  Cardiovascular: Positive for chest pain.  All other systems reviewed and are negative.    Allergies  Review of patient's allergies indicates no known allergies.  Home Medications   Current Outpatient Rx  Name  Route  Sig  Dispense  Refill  . acetaminophen (TYLENOL) 325 MG tablet   Oral   Take 650 mg by mouth every 6 (six) hours as needed. UAD           . ALPRAZolam (XANAX) 0.5 MG tablet   Oral   Take 0.5 mg by mouth at bedtime.          Marland Kitchen amiodarone (PACERONE) 200 MG tablet   Oral   Take 200 mg by mouth daily.           . cholecalciferol (VITAMIN D) 400 UNITS TABS   Oral   Take 400 Units by mouth daily.         . dabigatran (PRADAXA) 75 MG CAPS   Oral   Take 75 mg by mouth 2 (two) times daily.         . fluticasone (FLONASE) 50 MCG/ACT nasal spray   Nasal   Place 2 sprays into the nose daily.           Marland Kitchen galantamine (RAZADYNE ER) 24 MG 24 hr capsule   Oral   Take 24 mg by mouth daily with breakfast.           . HYDROcodone-acetaminophen (NORCO/VICODIN) 5-325 MG per tablet   Oral   Take 1 tablet by mouth every 6 (six) hours as needed.   30 tablet   1   . levothyroxine (SYNTHROID, LEVOTHROID) 100 MCG tablet   Oral   Take 100 mcg by mouth daily.         Marland Kitchen  levothyroxine (SYNTHROID, LEVOTHROID) 75 MCG tablet   Oral   Take by mouth daily. Take 1/2 tablet         . loratadine (CLARITIN) 10 MG tablet   Oral   Take 10 mg by mouth daily.           Marland Kitchen LORazepam (ATIVAN) 0.5 MG tablet   Oral   Take 0.5 mg by mouth at bedtime.         . nitroGLYCERIN (NITROSTAT) 0.4 MG SL tablet   Sublingual   Place 0.4 mg under the tongue every 5 (five) minutes as needed for chest pain. For chest pain         . pantoprazole (PROTONIX) 40 MG tablet   Oral   Take 40 mg by mouth daily.           . potassium chloride (KLOR-CON) 10 MEQ CR tablet   Oral   Take 10 mEq by mouth daily.           Marland Kitchen triamterene-hydrochlorothiazide (MAXZIDE-25) 37.5-25 MG per tablet   Oral   Take 1 tablet by mouth daily.           BP 183/57  Pulse 81  Temp(Src) 98.6 F (37 C) (Oral)  Resp 16  SpO2 97%  Physical Exam  Nursing note and vitals reviewed. Constitutional: She is oriented to person, place, and time. She appears well-developed and well-nourished.  Non-toxic appearance. No distress.  HENT:  Head:  Normocephalic and atraumatic.  Eyes: Conjunctivae, EOM and lids are normal. Pupils are equal, round, and reactive to light.  Neck: Normal range of motion. Neck supple. No tracheal deviation present. No mass present.  Cardiovascular: Normal rate, regular rhythm and normal heart sounds.  Exam reveals no gallop.   No murmur heard. Pulmonary/Chest: Effort normal and breath sounds normal. No stridor. No respiratory distress. She has no decreased breath sounds. She has no wheezes. She has no rhonchi. She has no rales.  Abdominal: Soft. Normal appearance and bowel sounds are normal. She exhibits no distension. There is no tenderness. There is no rebound and no CVA tenderness.  Musculoskeletal: Normal range of motion. She exhibits no edema and no tenderness.  Neurological: She is alert and oriented to person, place, and time. She has normal strength. No cranial nerve deficit or sensory deficit. GCS eye subscore is 4. GCS verbal subscore is 5. GCS motor subscore is 6.  Skin: Skin is warm and dry. No abrasion and no rash noted.  Psychiatric: She has a normal mood and affect. Her speech is normal and behavior is normal.    ED Course  Procedures (including critical care time)  Labs Reviewed - No data to display No results found.   No diagnosis found.    MDM   Date: 05/01/2012  Rate: 51  Rhythm: normal sinus rhythm  QRS Axis: normal  Intervals: normal  ST/T Wave abnormalities: normal  Conduction Disutrbances:none  Narrative Interpretation:   Old EKG Reviewed: none available    1:06 AM Spoke with patient's daughter at length and she states that patient's current symptoms are exactly like her prior bouts of acid reflux. She spoke with the nursing home staff and they confirm this as well 2. Patient has a normal EKG in here. Doubt patient has ACS. Marland Kitchen Her history was difficult to obtain from her due to her history of dementia. She appears to be stable at this time      Toy Baker,  MD 05/02/12 4696  Ethelene Browns  Deanna Artis, MD 05/27/12 1155

## 2012-05-02 LAB — COMPREHENSIVE METABOLIC PANEL
ALT: 28 U/L (ref 0–35)
AST: 29 U/L (ref 0–37)
CO2: 27 mEq/L (ref 19–32)
Calcium: 9.3 mg/dL (ref 8.4–10.5)
Chloride: 105 mEq/L (ref 96–112)
GFR calc non Af Amer: 39 mL/min — ABNORMAL LOW (ref >90)
Sodium: 141 mEq/L (ref 135–145)

## 2012-05-02 LAB — CBC WITH DIFFERENTIAL/PLATELET
Basophils Absolute: 0.1 10*3/uL (ref 0.0–0.1)
Eosinophils Relative: 2 % (ref 0–5)
Lymphocytes Relative: 43 % (ref 12–46)
Neutro Abs: 3.2 10*3/uL (ref 1.7–7.7)
Platelets: 180 10*3/uL (ref 150–400)
RDW: 15.2 % (ref 11.5–15.5)
WBC: 6.7 10*3/uL (ref 4.0–10.5)

## 2012-05-02 LAB — POCT I-STAT TROPONIN I

## 2012-05-02 NOTE — ED Notes (Signed)
Pt's daughter at bedside.  St's she thinks pt just had gas which she has had before and after belching pain subsided.  Pt denies any pain or discomfort at this time.

## 2012-08-15 ENCOUNTER — Encounter (HOSPITAL_COMMUNITY): Payer: Self-pay | Admitting: *Deleted

## 2012-08-15 ENCOUNTER — Emergency Department (HOSPITAL_COMMUNITY): Payer: Medicare Other

## 2012-08-15 ENCOUNTER — Inpatient Hospital Stay (HOSPITAL_COMMUNITY)
Admission: EM | Admit: 2012-08-15 | Discharge: 2012-08-17 | DRG: 309 | Disposition: A | Discharge status: Skilled Nursing Facility | Payer: Medicare Other | Attending: Internal Medicine | Admitting: Internal Medicine

## 2012-08-15 DIAGNOSIS — F0390 Unspecified dementia without behavioral disturbance: Secondary | ICD-10-CM | POA: Diagnosis present

## 2012-08-15 DIAGNOSIS — S0100XA Unspecified open wound of scalp, initial encounter: Secondary | ICD-10-CM | POA: Diagnosis present

## 2012-08-15 DIAGNOSIS — J4489 Other specified chronic obstructive pulmonary disease: Secondary | ICD-10-CM | POA: Diagnosis present

## 2012-08-15 DIAGNOSIS — I4891 Unspecified atrial fibrillation: Principal | ICD-10-CM | POA: Diagnosis present

## 2012-08-15 DIAGNOSIS — S0990XA Unspecified injury of head, initial encounter: Secondary | ICD-10-CM

## 2012-08-15 DIAGNOSIS — I498 Other specified cardiac arrhythmias: Secondary | ICD-10-CM | POA: Diagnosis present

## 2012-08-15 DIAGNOSIS — S0101XA Laceration without foreign body of scalp, initial encounter: Secondary | ICD-10-CM

## 2012-08-15 DIAGNOSIS — I10 Essential (primary) hypertension: Secondary | ICD-10-CM | POA: Diagnosis present

## 2012-08-15 DIAGNOSIS — I5030 Unspecified diastolic (congestive) heart failure: Secondary | ICD-10-CM | POA: Diagnosis present

## 2012-08-15 DIAGNOSIS — J449 Chronic obstructive pulmonary disease, unspecified: Secondary | ICD-10-CM | POA: Diagnosis present

## 2012-08-15 DIAGNOSIS — Z79899 Other long term (current) drug therapy: Secondary | ICD-10-CM

## 2012-08-15 DIAGNOSIS — K219 Gastro-esophageal reflux disease without esophagitis: Secondary | ICD-10-CM | POA: Diagnosis present

## 2012-08-15 DIAGNOSIS — I4892 Unspecified atrial flutter: Secondary | ICD-10-CM

## 2012-08-15 DIAGNOSIS — Y9210 Unspecified residential institution as the place of occurrence of the external cause: Secondary | ICD-10-CM | POA: Diagnosis present

## 2012-08-15 DIAGNOSIS — E039 Hypothyroidism, unspecified: Secondary | ICD-10-CM | POA: Diagnosis present

## 2012-08-15 DIAGNOSIS — Z87891 Personal history of nicotine dependence: Secondary | ICD-10-CM

## 2012-08-15 DIAGNOSIS — I509 Heart failure, unspecified: Secondary | ICD-10-CM | POA: Diagnosis present

## 2012-08-15 DIAGNOSIS — Z7901 Long term (current) use of anticoagulants: Secondary | ICD-10-CM

## 2012-08-15 DIAGNOSIS — Z8673 Personal history of transient ischemic attack (TIA), and cerebral infarction without residual deficits: Secondary | ICD-10-CM

## 2012-08-15 DIAGNOSIS — Z66 Do not resuscitate: Secondary | ICD-10-CM | POA: Diagnosis present

## 2012-08-15 DIAGNOSIS — I5032 Chronic diastolic (congestive) heart failure: Secondary | ICD-10-CM

## 2012-08-15 DIAGNOSIS — W010XXA Fall on same level from slipping, tripping and stumbling without subsequent striking against object, initial encounter: Secondary | ICD-10-CM | POA: Diagnosis present

## 2012-08-15 HISTORY — DX: Shortness of breath: R06.02

## 2012-08-15 HISTORY — DX: Transient cerebral ischemic attack, unspecified: G45.9

## 2012-08-15 HISTORY — DX: Unspecified osteoarthritis, unspecified site: M19.90

## 2012-08-15 LAB — COMPREHENSIVE METABOLIC PANEL
ALT: 29 U/L (ref 0–35)
AST: 26 U/L (ref 0–37)
Alkaline Phosphatase: 71 U/L (ref 39–117)
CO2: 26 mEq/L (ref 19–32)
Chloride: 104 mEq/L (ref 96–112)
Creatinine, Ser: 1.03 mg/dL (ref 0.50–1.10)
GFR calc non Af Amer: 50 mL/min — ABNORMAL LOW (ref >90)
Sodium: 140 mEq/L (ref 135–145)
Total Bilirubin: 0.3 mg/dL (ref 0.3–1.2)

## 2012-08-15 LAB — CBC
HCT: 40.2 % (ref 36.0–46.0)
Hemoglobin: 13.7 g/dL (ref 12.0–15.0)
MCH: 30.6 pg (ref 26.0–34.0)
MCHC: 34.1 g/dL (ref 30.0–36.0)
MCV: 89.7 fL (ref 78.0–100.0)
Platelets: 194 10*3/uL (ref 150–400)
RBC: 4.48 MIL/uL (ref 3.87–5.11)
RDW: 14.4 % (ref 11.5–15.5)
WBC: 7.9 10*3/uL (ref 4.0–10.5)

## 2012-08-15 LAB — URINALYSIS, ROUTINE W REFLEX MICROSCOPIC
Bilirubin Urine: NEGATIVE
Glucose, UA: NEGATIVE mg/dL
Hgb urine dipstick: NEGATIVE
Specific Gravity, Urine: 1.012 (ref 1.005–1.030)
pH: 7.5 (ref 5.0–8.0)

## 2012-08-15 LAB — CBC WITH DIFFERENTIAL/PLATELET
Basophils Absolute: 0 10*3/uL (ref 0.0–0.1)
HCT: 39.7 % (ref 36.0–46.0)
Lymphocytes Relative: 29 % (ref 12–46)
Monocytes Absolute: 0.6 10*3/uL (ref 0.1–1.0)
Neutro Abs: 4.2 10*3/uL (ref 1.7–7.7)
RDW: 14.3 % (ref 11.5–15.5)
WBC: 7.1 10*3/uL (ref 4.0–10.5)

## 2012-08-15 LAB — CREATININE, SERUM: Creatinine, Ser: 0.96 mg/dL (ref 0.50–1.10)

## 2012-08-15 MED ORDER — SODIUM CHLORIDE 0.9 % IV BOLUS (SEPSIS)
500.0000 mL | Freq: Once | INTRAVENOUS | Status: AC
  Administered 2012-08-15: 500 mL via INTRAVENOUS

## 2012-08-15 MED ORDER — LEVOTHYROXINE SODIUM 100 MCG PO TABS: 100.0000 ug | ORAL_TABLET | Freq: Every day | ORAL | Status: DC

## 2012-08-15 MED ORDER — NITROGLYCERIN 0.4 MG SL SUBL: 0.4000 mg | SUBLINGUAL_TABLET | SUBLINGUAL | Status: DC | PRN

## 2012-08-15 MED ORDER — ENOXAPARIN SODIUM 40 MG/0.4ML ~~LOC~~ SOLN
40.0000 mg | SUBCUTANEOUS | Status: DC
  Filled 2012-08-15: qty 0.4

## 2012-08-15 MED ORDER — POTASSIUM CHLORIDE CRYS ER 10 MEQ PO TBCR
10.0000 meq | EXTENDED_RELEASE_TABLET | Freq: Two times a day (BID) | ORAL | Status: DC
  Administered 2012-08-15 – 2012-08-17 (×4): 10 meq via ORAL
  Filled 2012-08-15 (×5): qty 1

## 2012-08-15 MED ORDER — TRIAMTERENE-HCTZ 37.5-25 MG PO TABS
1.0000 | ORAL_TABLET | Freq: Every day | ORAL | Status: DC
  Administered 2012-08-16 – 2012-08-17 (×2): 1 via ORAL
  Filled 2012-08-15 (×2): qty 1

## 2012-08-15 MED ORDER — HYDROCORTISONE 1 % EX CREA
1.0000 "application " | TOPICAL_CREAM | Freq: Two times a day (BID) | CUTANEOUS | Status: DC
  Filled 2012-08-15: qty 28

## 2012-08-15 MED ORDER — GALANTAMINE HYDROBROMIDE ER 24 MG PO CP24
24.0000 mg | ORAL_CAPSULE | Freq: Every day | ORAL | Status: DC
  Administered 2012-08-16 – 2012-08-17 (×2): 24 mg via ORAL
  Filled 2012-08-15 (×3): qty 1

## 2012-08-15 MED ORDER — DILTIAZEM HCL 100 MG IV SOLR
5.0000 mg/h | INTRAVENOUS | Status: DC
  Administered 2012-08-15: 15 mg/h via INTRAVENOUS
  Filled 2012-08-15: qty 100

## 2012-08-15 MED ORDER — LORATADINE 10 MG PO TABS
10.0000 mg | ORAL_TABLET | Freq: Every day | ORAL | Status: DC
  Administered 2012-08-16 – 2012-08-17 (×2): 10 mg via ORAL
  Filled 2012-08-15 (×2): qty 1

## 2012-08-15 MED ORDER — LORAZEPAM 0.5 MG PO TABS
0.5000 mg | ORAL_TABLET | Freq: Every day | ORAL | Status: DC
  Administered 2012-08-15 – 2012-08-16 (×2): 0.5 mg via ORAL
  Filled 2012-08-15 (×2): qty 1

## 2012-08-15 MED ORDER — AMIODARONE HCL 200 MG PO TABS
200.0000 mg | ORAL_TABLET | Freq: Every day | ORAL | Status: DC
  Administered 2012-08-16: 200 mg via ORAL
  Filled 2012-08-15 (×2): qty 1

## 2012-08-15 MED ORDER — HYDROCODONE-ACETAMINOPHEN 5-325 MG PO TABS: 1.0000 | ORAL_TABLET | Freq: Four times a day (QID) | ORAL | Status: DC | PRN

## 2012-08-15 MED ORDER — SODIUM CHLORIDE 0.9 % IJ SOLN
3.0000 mL | Freq: Two times a day (BID) | INTRAMUSCULAR | Status: DC
  Administered 2012-08-16 (×2): 3 mL via INTRAVENOUS

## 2012-08-15 MED ORDER — PANTOPRAZOLE SODIUM 40 MG PO TBEC: 40.0000 mg | DELAYED_RELEASE_TABLET | Freq: Every day | ORAL | Status: DC

## 2012-08-15 MED ORDER — LORAZEPAM 0.5 MG PO TABS
0.2500 mg | ORAL_TABLET | Freq: Two times a day (BID) | ORAL | Status: DC | PRN
  Administered 2012-08-16: 0.25 mg via ORAL
  Filled 2012-08-15 (×3): qty 1

## 2012-08-15 MED ORDER — LEVOTHYROXINE SODIUM 137 MCG PO TABS
137.0000 ug | ORAL_TABLET | Freq: Every day | ORAL | Status: DC
  Administered 2012-08-16: 137 ug via ORAL
  Filled 2012-08-15 (×2): qty 1

## 2012-08-15 MED ORDER — DABIGATRAN ETEXILATE MESYLATE 75 MG PO CAPS
75.0000 mg | ORAL_CAPSULE | Freq: Two times a day (BID) | ORAL | Status: DC
  Administered 2012-08-15 – 2012-08-16 (×3): 75 mg via ORAL
  Filled 2012-08-15 (×5): qty 1

## 2012-08-15 MED ORDER — LEVOTHYROXINE SODIUM 75 MCG PO TABS: 37.5000 ug | ORAL_TABLET | Freq: Every day | ORAL | Status: DC

## 2012-08-15 MED ORDER — ASPIRIN EC 325 MG PO TBEC
325.0000 mg | DELAYED_RELEASE_TABLET | Freq: Every day | ORAL | Status: DC
  Administered 2012-08-15 – 2012-08-16 (×2): 325 mg via ORAL
  Filled 2012-08-15 (×3): qty 1

## 2012-08-15 MED ORDER — TETANUS-DIPHTH-ACELL PERTUSSIS 5-2.5-18.5 LF-MCG/0.5 IM SUSP
0.5000 mL | Freq: Once | INTRAMUSCULAR | Status: AC
  Administered 2012-08-15: 0.5 mL via INTRAMUSCULAR
  Filled 2012-08-15 (×2): qty 0.5

## 2012-08-15 MED ORDER — CHOLECALCIFEROL 10 MCG (400 UNIT) PO TABS
400.0000 [IU] | ORAL_TABLET | Freq: Every day | ORAL | Status: DC
  Administered 2012-08-16 – 2012-08-17 (×2): 400 [IU] via ORAL
  Filled 2012-08-15 (×2): qty 1

## 2012-08-15 MED ORDER — DILTIAZEM HCL 100 MG IV SOLR
5.0000 mg/h | Freq: Once | INTRAVENOUS | Status: AC
  Administered 2012-08-15: 5 mg/h via INTRAVENOUS

## 2012-08-15 MED ORDER — DILTIAZEM HCL 25 MG/5ML IV SOLN
10.0000 mg | Freq: Once | INTRAVENOUS | Status: AC
  Administered 2012-08-15: 10 mg via INTRAVENOUS
  Filled 2012-08-15: qty 5

## 2012-08-15 NOTE — Progress Notes (Signed)
Received from ED. Assessed per flow sheet. Denies chest pain or shortness of breath. Patient oriented to self. Very confused and impulsive. Patient's daughter with patient and explained that the last admission she needed a safety sitter d/t increased dementia. Will possibly move patient to camera room when available for safety. Bed alarm in place. Will monitor. Mamie Levers

## 2012-08-15 NOTE — ED Notes (Addendum)
Per EMS pt from [facility] with c/o fall, witnesses say feet fell out from under her and she hit head on wheelchair. Unsure if LOC. Pt takes Pradaxa. Bleeding controlled. BP 172/101 HR 110-120.

## 2012-08-15 NOTE — Progress Notes (Signed)
PATIENT H.R. DROP TO 24 JUNCTIONAL FOR LESS THAN A MIN. THEN CAME UP TO 80-90 AT. FIB.Marland Kitchen CHARGE R.N. INTO ASSESS PATIENT AND PATIENT WAS ALERT AND HAND. TELE BOX IN HAND. SETTER AT BEDSIDE . V.S.S. PRIOR TO EVENT. STOPPED CARDIZEM DRIP. TClaiborne Billings PAGED AND AWARE OF THE EVENT AND THAT CARDIZEM WAS STOPPED. HE ORDERED TO RESTART CARDIZEM DRIP AT 5 ML/.HR .RSOURCE R.N. AWARE AND INTO RESTART DRIP AT 5 ML/HR. CONT. TO MONITOR PATIENT AND RHYTHM.

## 2012-08-15 NOTE — H&P (Signed)
[facility] History and Physical  Hannah Bowers NFA:[id number] DOB: 03-07-1930 DOA: 08/15/2012  Referring physician: Emergency Department PCP: Janece Canterbury, MD  Specialists: Dr. Ladona Ridgel - Cardiologist  Chief Complaint: Fall  HPI: Hannah Bowers is a 79 y.o. female with a hx of afib and a documented hx of diastolic chf who presents to the hospital s/p mechanical fall resulting in a laceration to the head. CT of the head and neck were unremarkable. However, the pt was noted to have a resting HR in the upper 120's not improved with 10mg  IV cardizem. The pt was subsequently started on a cardizem gtt and the hospitalist service was consulted for possible admission.   Review of Systems: tachycardia, all other review of systems negative  Past Medical History  Diagnosis Date  . Hypertension   . Atrial flutter     paroxysmal  . GERD (gastroesophageal reflux disease)   . Dementia   . COPD (chronic obstructive pulmonary disease)   . Shortness of breath   . Transient ischemic attack (TIA)   . Arthritis    Past Surgical History  Procedure Laterality Date  . Radiofrequency ablation  2001  . Orif ankle fracture  02/01/2012    Procedure: OPEN REDUCTION INTERNAL FIXATION (ORIF) ANKLE FRACTURE;  Surgeon: Emilee Hero, MD;  Location: [facility];  Service: Orthopedics;  Laterality: Left;   Social History:  reports that she quit smoking about 34 years ago. She has never used smokeless tobacco. She reports that she does not drink alcohol or use illicit drugs.   No Known Allergies  Family History  Problem Relation Age of Onset  . Alzheimer's disease Father     dementia  . Other Mother     possible MS    (be sure to complete)  Prior to Admission medications   Medication Sig Start Date End Date Taking? Authorizing Provider  amiodarone (PACERONE) 200 MG tablet Take 200 mg by mouth daily.     Yes Historical Provider, MD  cholecalciferol (VITAMIN D) 400 UNITS TABS Take 400 Units by mouth  daily.   Yes Historical Provider, MD  dabigatran (PRADAXA) 75 MG CAPS Take 75 mg by mouth 2 (two) times daily.   Yes Historical Provider, MD  fluticasone (FLONASE) 50 MCG/ACT nasal spray Place 2 sprays into the nose daily.     Yes Historical Provider, MD  galantamine (RAZADYNE ER) 24 MG 24 hr capsule Take 24 mg by mouth daily with breakfast.     Yes Historical Provider, MD  hydrocortisone cream 1 % Apply 1 application topically 2 (two) times daily. Apply to face twice daily until rash improves.   Yes Historical Provider, MD  levothyroxine (SYNTHROID, LEVOTHROID) 100 MCG tablet Take 100 mcg by mouth daily. Take everyday along with 37.5 mcg to equal 137.5 mcg dose.   Yes Historical Provider, MD  levothyroxine (SYNTHROID, LEVOTHROID) 75 MCG tablet Take 37.5 mcg by mouth daily. Take every day along with 100 mcg tablet to equal 137.5 mcg   Yes Historical Provider, MD  loratadine (CLARITIN) 10 MG tablet Take 10 mg by mouth daily.     Yes Historical Provider, MD  LORazepam (ATIVAN) 0.5 MG tablet Take 0.25 mg by mouth 2 (two) times daily as needed for anxiety.    Yes Historical Provider, MD  LORazepam (ATIVAN) 0.5 MG tablet Take 0.5 mg by mouth at bedtime.   Yes Historical Provider, MD  pantoprazole (PROTONIX) 40 MG tablet Take 40 mg by mouth daily.     Yes Historical  Provider, MD  potassium chloride (KLOR-CON) 10 MEQ CR tablet Take 10 mEq by mouth daily.     Yes Historical Provider, MD  triamterene-hydrochlorothiazide (MAXZIDE-25) 37.5-25 MG per tablet Take 1 tablet by mouth daily.   Yes Historical Provider, MD  acetaminophen (TYLENOL) 325 MG tablet Take 650 mg by mouth every 6 (six) hours as needed for pain or fever. UAD    Historical Provider, MD  HYDROcodone-acetaminophen (NORCO/VICODIN) 5-325 MG per tablet Take 1 tablet by mouth every 6 (six) hours as needed. 02/02/12   Emilee Hero, MD  nitroGLYCERIN (NITROSTAT) 0.4 MG SL tablet Place 0.4 mg under the tongue every 5 (five) minutes as needed for  chest pain. For chest pain    Historical Provider, MD   Physical Exam: Filed Vitals:   08/15/12 1430 08/15/12 1500 08/15/12 1530 08/15/12 1545  BP: 173/86 159/86 149/84   Pulse: 125 104 88 111  Resp: 17 23 20 25   Height:   5\' 5"  (1.651 m) 5\' 5"  (1.651 m)  Weight:   89.404 kg (197 lb 1.6 oz) 89.404 kg (197 lb 1.6 oz)  SpO2: 96% 95% 95% 95%     General:  Awake in NAD  Eyes: PEERL  ENT: mucus membranes moist, dentition fair  Neck: trachea midline, neck supple  Cardiovascular: tachycardic, irregularly irregular  Respiratory: normal resp effort, no wheezing  Abdomen: soft, nondistended  Skin: no abnormal skin lesions seen  Musculoskeletal: perfused, no clubbing or cyanosis  Psychiatric: slightly confused  Neurologic: cn2-12 grossly intact, strength and sensation intact throughout  Labs on Admission:  Basic Metabolic Panel:  Recent Labs Lab 08/15/12 1250  NA 140  K 3.9  CL 104  CO2 26  GLUCOSE 103*  BUN 20  CREATININE 1.03  CALCIUM 9.4   Liver Function Tests:  Recent Labs Lab 08/15/12 1250  AST 26  ALT 29  ALKPHOS 71  BILITOT 0.3  PROT 7.0  ALBUMIN 3.6   No results found for this basename: LIPASE, AMYLASE,  in the last 168 hours No results found for this basename: AMMONIA,  in the last 168 hours CBC:  Recent Labs Lab 08/15/12 1250 08/15/12 1658  WBC 7.1 7.9  NEUTROABS 4.2  --   HGB 13.5 13.7  HCT 39.7 40.2  MCV 89.2 89.7  PLT 191 194   Cardiac Enzymes:  Recent Labs Lab 08/15/12 1250  TROPONINI <0.30    BNP (last 3 results)  Recent Labs  02/01/12 1736  PROBNP 274.9   CBG: No results found for this basename: GLUCAP,  in the last 168 hours  Radiological Exams on Admission: Ct Head Wo Contrast  08/15/2012   *RADIOLOGY REPORT*  Clinical Data:  Fall with head and neck injury.  Pain.  CT HEAD WITHOUT CONTRAST CT CERVICAL SPINE WITHOUT CONTRAST  Technique:  Multidetector CT imaging of the head and cervical spine was performed  following the standard protocol without intravenous contrast.  Multiplanar CT image reconstructions of the cervical spine were also generated.  Comparison:  02/01/2012 head CT  CT HEAD  Findings: Mild generalized cerebral volume loss noted.  No acute intracranial abnormalities are identified, including mass lesion or mass effect, hydrocephalus, extra-axial fluid collection, midline shift, hemorrhage, or acute infarction.  The visualized bony calvarium is unremarkable.  A small amount of fluid within the paranasal sinuses noted.  IMPRESSION: No evidence of acute intracranial abnormality.  CT CERVICAL SPINE  Findings: Normal alignment is noted. There is no evidence of acute fracture, subluxation or prevertebral soft tissue  swelling. Mild degenerative disc disease and spondylosis at C5-C6 and C6-C7 noted. No focal bony lesions are present. Soft tissue structures are unremarkable.  IMPRESSION: No static evidence of acute injury to the cervical spine.  Mild degenerative changes within the lower cervical spine.   Original Report Authenticated By: Harmon Pier, M.D.   Ct Cervical Spine Wo Contrast  08/15/2012   *RADIOLOGY REPORT*  Clinical Data:  Fall with head and neck injury.  Pain.  CT HEAD WITHOUT CONTRAST CT CERVICAL SPINE WITHOUT CONTRAST  Technique:  Multidetector CT imaging of the head and cervical spine was performed following the standard protocol without intravenous contrast.  Multiplanar CT image reconstructions of the cervical spine were also generated.  Comparison:  02/01/2012 head CT  CT HEAD  Findings: Mild generalized cerebral volume loss noted.  No acute intracranial abnormalities are identified, including mass lesion or mass effect, hydrocephalus, extra-axial fluid collection, midline shift, hemorrhage, or acute infarction.  The visualized bony calvarium is unremarkable.  A small amount of fluid within the paranasal sinuses noted.  IMPRESSION: No evidence of acute intracranial abnormality.  CT CERVICAL  SPINE  Findings: Normal alignment is noted. There is no evidence of acute fracture, subluxation or prevertebral soft tissue swelling. Mild degenerative disc disease and spondylosis at C5-C6 and C6-C7 noted. No focal bony lesions are present. Soft tissue structures are unremarkable.  IMPRESSION: No static evidence of acute injury to the cervical spine.  Mild degenerative changes within the lower cervical spine.   Original Report Authenticated By: Harmon Pier, M.D.    EKG: Independently reviewed. afib with RVR  Assessment/Plan Principal Problem:   Atrial fibrillation Active Problems:   HYPERTENSION   Diastolic heart failure   A. Fib with RVR: - HR in the 120-130's in the ED, requiring a cardizem gtt - Will continue a cardizem gtt for now with plans to maintain resting HR <100 - Will admit to step down with tele Dementia: - Stable - Will monitor closely DVT prophylaxis: - Lovenox for prophylaxis HTN: - BP stable -Will cont current home regimen - Cont to monitor Hypothyroidism: - Will continue home levothyroxine regimen - Will check TSH  Code Status: DNR (must indicate code status--if unknown or must be presumed, indicate so) Family Communication: Pt and daughter in room (indicate person spoken with, if applicable, with phone number if by telephone) Disposition Plan: Pending (indicate anticipated LOS)  Time spent:  CHIU, Scheryl Marten [facility] Pager [phone]  If 7PM-7AM, please contact night-coverage [email or web address] Password [facility] 08/15/2012, 5:25 PM

## 2012-08-15 NOTE — ED Provider Notes (Signed)
History     CSN: [id number]  Arrival date & time 08/15/12  1204   First MD Initiated Contact with Patient 08/15/12 1215      Chief Complaint  Patient presents with  . Fall    (Consider location/radiation/quality/duration/timing/severity/associated sxs/prior treatment) HPI Pt with history of dementia on Pradaxa for afib had an unwitnessed fall from standing which she claims was due to tripping. States she was feeling lightheaded before fall and after. No LOC. +head trauma. No neck pain. No focal weakness or numbness. No CP, SOB, palpitations. No lower ext swelling or pain.  Past Medical History  Diagnosis Date  . Hypertension   . Atrial flutter     paroxysmal  . GERD (gastroesophageal reflux disease)   . Dementia   . COPD (chronic obstructive pulmonary disease)     Past Surgical History  Procedure Laterality Date  . Radiofrequency ablation  2001  . Orif ankle fracture  02/01/2012    Procedure: OPEN REDUCTION INTERNAL FIXATION (ORIF) ANKLE FRACTURE;  Surgeon: Emilee Hero, MD;  Location: [facility];  Service: Orthopedics;  Laterality: Left;    Family History  Problem Relation Age of Onset  . Alzheimer's disease Father     dementia  . Other Mother     possible MS    History  Substance Use Topics  . Smoking status: Former Smoker -- 0.25 packs/day for 4 years    Quit date: 01/31/1978  . Smokeless tobacco: Never Used  . Alcohol Use: No    OB History   Grav Para Term Preterm Abortions TAB SAB Ect Mult Living                  Review of Systems  Constitutional: Negative for fever, chills and fatigue.  HENT: Negative for neck pain and neck stiffness.   Eyes: Negative for visual disturbance.  Respiratory: Negative for chest tightness and shortness of breath.   Cardiovascular: Negative for chest pain, palpitations and leg swelling.  Gastrointestinal: Negative for nausea, vomiting, abdominal pain, diarrhea and constipation.  Genitourinary: Negative for dysuria  and frequency.  Musculoskeletal: Negative for myalgias, back pain and arthralgias.  Skin: Positive for wound. Negative for rash.  Neurological: Positive for dizziness and light-headedness. Negative for syncope, weakness, numbness and headaches.  All other systems reviewed and are negative.    Allergies  Review of patient's allergies indicates no known allergies.  Home Medications   Current Outpatient Rx  Name  Route  Sig  Dispense  Refill  . amiodarone (PACERONE) 200 MG tablet   Oral   Take 200 mg by mouth daily.           . cholecalciferol (VITAMIN D) 400 UNITS TABS   Oral   Take 400 Units by mouth daily.         . dabigatran (PRADAXA) 75 MG CAPS   Oral   Take 75 mg by mouth 2 (two) times daily.         . fluticasone (FLONASE) 50 MCG/ACT nasal spray   Nasal   Place 2 sprays into the nose daily.           Marland Kitchen galantamine (RAZADYNE ER) 24 MG 24 hr capsule   Oral   Take 24 mg by mouth daily with breakfast.           . hydrocortisone cream 1 %   Topical   Apply 1 application topically 2 (two) times daily. Apply to face twice daily until rash improves.         Marland Kitchen  levothyroxine (SYNTHROID, LEVOTHROID) 100 MCG tablet   Oral   Take 100 mcg by mouth daily. Take everyday along with 37.5 mcg to equal 137.5 mcg dose.         . levothyroxine (SYNTHROID, LEVOTHROID) 75 MCG tablet   Oral   Take 37.5 mcg by mouth daily. Take every day along with 100 mcg tablet to equal 137.5 mcg         . loratadine (CLARITIN) 10 MG tablet   Oral   Take 10 mg by mouth daily.           Marland Kitchen LORazepam (ATIVAN) 0.5 MG tablet   Oral   Take 0.25 mg by mouth 2 (two) times daily as needed for anxiety.          Marland Kitchen LORazepam (ATIVAN) 0.5 MG tablet   Oral   Take 0.5 mg by mouth at bedtime.         . pantoprazole (PROTONIX) 40 MG tablet   Oral   Take 40 mg by mouth daily.           . potassium chloride (KLOR-CON) 10 MEQ CR tablet   Oral   Take 10 mEq by mouth daily.            Marland Kitchen triamterene-hydrochlorothiazide (MAXZIDE-25) 37.5-25 MG per tablet   Oral   Take 1 tablet by mouth daily.         Marland Kitchen acetaminophen (TYLENOL) 325 MG tablet   Oral   Take 650 mg by mouth every 6 (six) hours as needed for pain or fever. UAD         . HYDROcodone-acetaminophen (NORCO/VICODIN) 5-325 MG per tablet   Oral   Take 1 tablet by mouth every 6 (six) hours as needed.   30 tablet   1   . nitroGLYCERIN (NITROSTAT) 0.4 MG SL tablet   Sublingual   Place 0.4 mg under the tongue every 5 (five) minutes as needed for chest pain. For chest pain           BP 149/84  Pulse 88  Resp 20  SpO2 95%  Physical Exam  Nursing note and vitals reviewed. Constitutional: She is oriented to person, place, and time. She appears well-developed and well-nourished. No distress.  HENT:  Head: Normocephalic.  Mouth/Throat: Oropharynx is clear and moist.  Eyes: EOM are normal. Pupils are equal, round, and reactive to light.  Neck: Normal range of motion. Neck supple.  No posterior cervical spine TTP  Cardiovascular:  Tachy irreg irreg  Pulmonary/Chest: Effort normal and breath sounds normal. No respiratory distress. She has no wheezes. She has no rales. She exhibits no tenderness.  Abdominal: Soft. Bowel sounds are normal. She exhibits no distension and no mass. There is no tenderness. There is no rebound and no guarding.  Musculoskeletal: Normal range of motion. She exhibits no edema and no tenderness.  5/5 motor in all ext, sensation intact, mild confusion  Neurological: She is alert and oriented to person, place, and time.  Skin: Skin is warm and dry. No rash noted. No erythema.  Psychiatric: She has a normal mood and affect. Her behavior is normal.    ED Course  Procedures (including critical care time)  Labs Reviewed  COMPREHENSIVE METABOLIC PANEL - Abnormal; Notable for the following:    Glucose, Bld 103 (*)    GFR calc non Af Amer 50 (*)    GFR calc Af Amer 58 (*)    All  other components within normal limits  CBC WITH DIFFERENTIAL  PROTIME-INR  TROPONIN I  URINALYSIS, ROUTINE W REFLEX MICROSCOPIC  APTT   Ct Head Wo Contrast  08/15/2012   *RADIOLOGY REPORT*  Clinical Data:  Fall with head and neck injury.  Pain.  CT HEAD WITHOUT CONTRAST CT CERVICAL SPINE WITHOUT CONTRAST  Technique:  Multidetector CT imaging of the head and cervical spine was performed following the standard protocol without intravenous contrast.  Multiplanar CT image reconstructions of the cervical spine were also generated.  Comparison:  02/01/2012 head CT  CT HEAD  Findings: Mild generalized cerebral volume loss noted.  No acute intracranial abnormalities are identified, including mass lesion or mass effect, hydrocephalus, extra-axial fluid collection, midline shift, hemorrhage, or acute infarction.  The visualized bony calvarium is unremarkable.  A small amount of fluid within the paranasal sinuses noted.  IMPRESSION: No evidence of acute intracranial abnormality.  CT CERVICAL SPINE  Findings: Normal alignment is noted. There is no evidence of acute fracture, subluxation or prevertebral soft tissue swelling. Mild degenerative disc disease and spondylosis at C5-C6 and C6-C7 noted. No focal bony lesions are present. Soft tissue structures are unremarkable.  IMPRESSION: No static evidence of acute injury to the cervical spine.  Mild degenerative changes within the lower cervical spine.   Original Report Authenticated By: Harmon Pier, M.D.   Ct Cervical Spine Wo Contrast  08/15/2012   *RADIOLOGY REPORT*  Clinical Data:  Fall with head and neck injury.  Pain.  CT HEAD WITHOUT CONTRAST CT CERVICAL SPINE WITHOUT CONTRAST  Technique:  Multidetector CT imaging of the head and cervical spine was performed following the standard protocol without intravenous contrast.  Multiplanar CT image reconstructions of the cervical spine were also generated.  Comparison:  02/01/2012 head CT  CT HEAD  Findings: Mild  generalized cerebral volume loss noted.  No acute intracranial abnormalities are identified, including mass lesion or mass effect, hydrocephalus, extra-axial fluid collection, midline shift, hemorrhage, or acute infarction.  The visualized bony calvarium is unremarkable.  A small amount of fluid within the paranasal sinuses noted.  IMPRESSION: No evidence of acute intracranial abnormality.  CT CERVICAL SPINE  Findings: Normal alignment is noted. There is no evidence of acute fracture, subluxation or prevertebral soft tissue swelling. Mild degenerative disc disease and spondylosis at C5-C6 and C6-C7 noted. No focal bony lesions are present. Soft tissue structures are unremarkable.  IMPRESSION: No static evidence of acute injury to the cervical spine.  Mild degenerative changes within the lower cervical spine.   Original Report Authenticated By: Harmon Pier, M.D.     1. Closed head injury, initial encounter   2. Scalp laceration, initial encounter   3. Atrial fibrillation with RVR      Date: 08/15/2012  Rate: 138  Rhythm: atrial fibrillation  QRS Axis: normal  Intervals: normal  ST/T Wave abnormalities: nonspecific T wave changes  Conduction Disutrbances:none  Narrative Interpretation:   Old EKG Reviewed: changes noted  LACERATION REPAIR Performed by: Loren Racer Authorized by: Ranae Palms, Jossiah Smoak Consent: Verbal consent obtained. Risks and benefits: risks, benefits and alternatives were discussed Consent given by: patient Patient identity confirmed: provided demographic data Prepped and Draped in normal sterile fashion Wound explored  Laceration Location: scalp  Laceration Length: 7 cm  No Foreign Bodies seen or palpated  Anesthesia: local infiltration  Local anesthetic: lidocaine 1% with epinephrine  Anesthetic total: 3 ml  Irrigation method: syringe Amount of cleaning: standard  Skin closure: close  Number of staples: 5  Technique: simple  Patient tolerance:  Patient  tolerated the procedure well with no immediate complications.   MDM   Discussed with Dr Rhona Leavens. Will admit to tele bed. Pt remains stable in ED.        Loren Racer, MD 08/15/12 1534

## 2012-08-15 NOTE — Progress Notes (Signed)
Patient very impulsive and still continues to try to get out of bed. Patient removed IV. "I don't know why I need this or why I am here." New IV inserted per Lonia Blood, RN. Reassurance provided. Patient refused to wear protective arm sleeve to protect IV. "It's too tight." Kerlex gauze applied to secure IV. Will continue to monitor. Hannah Bowers

## 2012-08-15 NOTE — ED Notes (Signed)
Dr. Ranae Palms at bedside with suture cart.

## 2012-08-15 NOTE — ED Notes (Signed)
Pt head cleaned with hydrogen peroxide and gauze. Hematoma noted at top of scalp. Bleeding controlled.

## 2012-08-16 ENCOUNTER — Telehealth: Payer: Self-pay | Admitting: Internal Medicine

## 2012-08-16 DIAGNOSIS — I5032 Chronic diastolic (congestive) heart failure: Secondary | ICD-10-CM

## 2012-08-16 DIAGNOSIS — S0990XA Unspecified injury of head, initial encounter: Secondary | ICD-10-CM

## 2012-08-16 DIAGNOSIS — I4891 Unspecified atrial fibrillation: Principal | ICD-10-CM

## 2012-08-16 DIAGNOSIS — I10 Essential (primary) hypertension: Secondary | ICD-10-CM

## 2012-08-16 DIAGNOSIS — S0100XA Unspecified open wound of scalp, initial encounter: Secondary | ICD-10-CM

## 2012-08-16 LAB — COMPREHENSIVE METABOLIC PANEL
Alkaline Phosphatase: 62 U/L (ref 39–117)
BUN: 18 mg/dL (ref 6–23)
GFR calc Af Amer: 65 mL/min — ABNORMAL LOW (ref >90)
Glucose, Bld: 82 mg/dL (ref 70–99)
Potassium: 4.2 mEq/L (ref 3.5–5.1)
Total Protein: 6.4 g/dL (ref 6.0–8.3)

## 2012-08-16 LAB — CBC
HCT: 38.5 % (ref 36.0–46.0)
Platelets: 176 10*3/uL (ref 150–400)
RDW: 14.7 % (ref 11.5–15.5)
WBC: 6.1 10*3/uL (ref 4.0–10.5)

## 2012-08-16 LAB — TSH: TSH: 0.07 u[IU]/mL — ABNORMAL LOW (ref 0.350–4.500)

## 2012-08-16 MED ORDER — LEVOTHYROXINE SODIUM 112 MCG PO TABS
112.0000 ug | ORAL_TABLET | Freq: Every day | ORAL | Status: DC
  Administered 2012-08-17: 112 ug via ORAL
  Filled 2012-08-16 (×2): qty 1

## 2012-08-16 NOTE — Progress Notes (Signed)
PATIENT HAVING PAUSES 2.71 FOLLOWED B ONE SINUS BEAT THEN 3.04 PAUSE FOLLOWED BY A  SINUS RHYTHM BRAT  THEN A PAIR OF PVC'S THEN BACK TO AT. FIB. Marland Kitchen PATIENT VOICED NO COMPLAINTS. CHARGE NURSE AWARE AND RESOURCE R.N. AWARE. T. [facility] PAGED AND AWARE.SEE ORDERS TO D.C. CARDIZEM DRIP.

## 2012-08-16 NOTE — Clinical Social Work Psychosocial (Signed)
     Clinical Social Work Department BRIEF PSYCHOSOCIAL ASSESSMENT 08/16/2012  Patient:  Hannah Bowers, Hannah Bowers     Account Number:  [id number]     Admit date:  08/15/2012  Clinical Social Worker:  Hulan Fray  Date/Time:  08/16/2012 10:55 AM  Referred by:  Care Management  Date Referred:  08/16/2012 Referred for  Other - See comment   Other Referral:   Admit from facility   Interview type:  Family Other interview type:   daughter    PSYCHOSOCIAL DATA Living Status:  FACILITY Admitted from facility:  [facility] Level of care:  Assisted Living Primary support name:  Beverley Fiedler Primary support relationship to patient:  CHILD, ADULT Degree of support available:   supportive    CURRENT CONCERNS Current Concerns  Post-Acute Placement   Other Concerns:    SOCIAL WORK ASSESSMENT / PLAN Clinical Social Worker received referral for patient being admitted from facility. CSW reviewed chart and spoke with daughter. CSW intoduced self and explained reason for call. Daughter reported that patient is a resident of [facility]. Per daughter, patient is to return at discharge.  Per daughter, she  thought patient was being discharged today, but daughter plans to be out of town later this afternoon. Daughter reported that if patient is discharged today, she would be available to transport patient back to facility before 2pm.    CSW called [facility] and left voice message for admissions coordinator. CSW will complete FL2 for MD's signature and continue to follow to faciliate discharge back to facility.   Assessment/plan status:  Psychosocial Support/Ongoing Assessment of Needs Other assessment/ plan:   Information/referral to community resources:   Patient is from facility    PATIENTS/FAMILYS RESPONSE TO PLAN OF CARE: Per daughter, plan is to return back to [facility] at discharge. Daughter plans to transport patient back to facility, if  discharge is planned for today and everything is completed before 2pm. Daughter was appreciative of CSW's assistance.

## 2012-08-16 NOTE — Progress Notes (Signed)
PATIENT CONVERTED TO S.B. RATE IN THE 50'S AND S.R. RATE IN THE 60'S CONT. TO MONITOR RHYTHM AND RATE AND PATIENT,. CHARGE R.N. AWARE AND RESOURCE R.N. Eather Colas,.

## 2012-08-16 NOTE — Progress Notes (Signed)
Utilization review completed. Arla Boutwell, RN, BSN. 

## 2012-08-16 NOTE — Telephone Encounter (Signed)
Will forward to Dr Ladona Ridgel so he will be aware

## 2012-08-16 NOTE — Consult Note (Signed)
CARDIOLOGY CONSULT NOTE  Patient ID: Hannah Bowers MRN: [id number] DOB/AGE: 1930-06-14 79 y.o.  Admit date: 08/15/2012 Primary Physician Janece Canterbury, MD Primary Cardiologist Dr. Ladona Ridgel Chief Complaint  Atrial fibrillation  HPI:   The patient has dementia so the history provided by her is incomplete.  I was able to tolk to her daughter on the phone and she reports no recent cardiac complaints.  She was admitted after a fall with head trauma.  She has a history of afib with amiodarone therapy, HTN and diastolic HF.  At the last office visit with Dr. Ladona Ridgel in 2/13 she seemed to be maintaining NSR.  She denies any acute cardiovascular symptoms.  The patient denies any new symptoms such as chest discomfort, neck or arm discomfort. There has been no new shortness of breath, PND or orthopnea. There have been no reported palpitations, presyncope or syncope.  She was in atrial fib with a rapid rate when admitted.  She was treated with IV Cardizem.  I have been through all of the telemetry and nursing notes.  She was noted to have some brady rates in afib on Cardizem.  However, she converted to sinus at 00:37.  She has had bradycardia asymptomatic with some rates in the 30s while resting.     Past Medical History  Diagnosis Date  . Hypertension   . Atrial flutter     paroxysmal  . GERD (gastroesophageal reflux disease)   . Dementia   . COPD (chronic obstructive pulmonary disease)   . Shortness of breath   . Transient ischemic attack (TIA)   . Arthritis     Past Surgical History  Procedure Laterality Date  . Radiofrequency ablation  2001  . Orif ankle fracture  02/01/2012    Procedure: OPEN REDUCTION INTERNAL FIXATION (ORIF) ANKLE FRACTURE;  Surgeon: Emilee Hero, MD;  Location: [facility];  Service: Orthopedics;  Laterality: Left;    No Known Allergies Prescriptions prior to admission  Medication Sig Dispense Refill  . amiodarone (PACERONE) 200 MG tablet Take 200 mg by mouth daily.         . cholecalciferol (VITAMIN D) 400 UNITS TABS Take 400 Units by mouth daily.      . dabigatran (PRADAXA) 75 MG CAPS Take 75 mg by mouth 2 (two) times daily.      . fluticasone (FLONASE) 50 MCG/ACT nasal spray Place 2 sprays into the nose daily.        Marland Kitchen galantamine (RAZADYNE ER) 24 MG 24 hr capsule Take 24 mg by mouth daily with breakfast.        . hydrocortisone cream 1 % Apply 1 application topically 2 (two) times daily. Apply to face twice daily until rash improves.      Marland Kitchen levothyroxine (SYNTHROID, LEVOTHROID) 100 MCG tablet Take 100 mcg by mouth daily. Take everyday along with 37.5 mcg to equal 137.5 mcg dose.      . levothyroxine (SYNTHROID, LEVOTHROID) 75 MCG tablet Take 37.5 mcg by mouth daily. Take every day along with 100 mcg tablet to equal 137.5 mcg      . loratadine (CLARITIN) 10 MG tablet Take 10 mg by mouth daily.        Marland Kitchen LORazepam (ATIVAN) 0.5 MG tablet Take 0.25 mg by mouth 2 (two) times daily as needed for anxiety.       Marland Kitchen LORazepam (ATIVAN) 0.5 MG tablet Take 0.5 mg by mouth at bedtime.      . pantoprazole (PROTONIX) 40 MG tablet Take 40 mg  by mouth daily.        . potassium chloride (KLOR-CON) 10 MEQ CR tablet Take 10 mEq by mouth daily.        Marland Kitchen triamterene-hydrochlorothiazide (MAXZIDE-25) 37.5-25 MG per tablet Take 1 tablet by mouth daily.      Marland Kitchen acetaminophen (TYLENOL) 325 MG tablet Take 650 mg by mouth every 6 (six) hours as needed for pain or fever. UAD      . HYDROcodone-acetaminophen (NORCO/VICODIN) 5-325 MG per tablet Take 1 tablet by mouth every 6 (six) hours as needed.  30 tablet  1  . nitroGLYCERIN (NITROSTAT) 0.4 MG SL tablet Place 0.4 mg under the tongue every 5 (five) minutes as needed for chest pain. For chest pain       Family History  Problem Relation Age of Onset  . Alzheimer's disease Father     dementia  . Other Mother     possible MS    History   Social History  . Marital Status: Married    Spouse Name: N/A    Number of Children: N/A  . Years  of Education: N/A   Occupational History  . Not on file.   Social History Main Topics  . Smoking status: Former Smoker -- 0.25 packs/day for 4 years    Quit date: 01/31/1978  . Smokeless tobacco: Never Used  . Alcohol Use: No  . Drug Use: No  . Sexually Active: Not on file   Other Topics Concern  . Not on file   Social History Narrative  . No narrative on file     ROS:  As stated in the HPI and negative for all other systems.  Physical Exam: Blood pressure 136/47, pulse 57, temperature 98.1 F (36.7 C), temperature source Oral, resp. rate 18, height 5\' 5"  (1.651 m), weight 196 lb 1.6 oz (88.95 kg), SpO2 96.00%.  GENERAL:  Well appearing HEENT:  Pupils equal round and reactive, fundi not visualized, oral mucosa unremarkable NECK:  No jugular venous distention, waveform within normal limits, carotid upstroke brisk and symmetric, left bruits, no thyromegaly LYMPHATICS:  No cervical, inguinal adenopathy LUNGS:  Clear to auscultation bilaterally BACK:  No CVA tenderness CHEST:  Unremarkable HEART:  PMI not displaced or sustained,S1 and S2 within normal limits, no S3, no S4, no clicks, no rubs, soft apical early peaking systolic murmur without diastolic murmurs ABD:  Flat, positive bowel sounds normal in frequency in pitch, no bruits, no rebound, no guarding, no midline pulsatile mass, no hepatomegaly, no splenomegaly EXT:  2 plus pulses throughout, no edema, no cyanosis no clubbing SKIN:  No rashes no nodules NEURO:  Cranial nerves II through XII grossly intact, motor grossly intact throughout PSYCH:  Confused but very pleasant.     Labs: Lab Results  Component Value Date   BUN 18 08/16/2012   Lab Results  Component Value Date   CREATININE 0.94 08/16/2012   Lab Results  Component Value Date   NA 139 08/16/2012   K 4.2 08/16/2012   CL 105 08/16/2012   CO2 28 08/16/2012   Lab Results  Component Value Date   TROPONINI <0.30 08/15/2012   Lab Results  Component Value Date     WBC 6.1 08/16/2012   HGB 12.6 08/16/2012   HCT 38.5 08/16/2012   MCV 91.0 08/16/2012   PLT 176 08/16/2012   No results found for this basename: CHOL, HDL, LDLCALC, LDLDIRECT, TRIG, CHOLHDL   Lab Results  Component Value Date   ALT 24 08/16/2012  AST 23 08/16/2012   ALKPHOS 62 08/16/2012   BILITOT 0.4 08/16/2012   EKG:  Atrial fibrillation, rate 138, LAD, no acute ST T wave changes.  08/16/2012  ASSESSMENT AND PLAN:   ATRIAL FIBRILLATION:  At this point I see no need to change from her previous medications.  She can continue the amiodarone and the Pradaxa.  We will arrange follow up in our clinic.  We might consider a home monitor if there are any symptoms or findings in the future of sustained tachy arrhythmias.  However, I would have a high threshold for doing this as it would be difficult for the patient to wear a monitor.  HTN:  The blood pressure is at target. No change in medications is indicated. We will continue with therapeutic lifestyle changes (TLC).  DIASTOLIC HF:  She seems to be euvolemic.  At this point, no change in therapy is indicated.  We have reviewed salt and fluid restrictions.  No further cardiovascular testing is indicated.   SignedRollene Rotunda 08/16/2012, 1:51 PM

## 2012-08-16 NOTE — Care Management Note (Unsigned)
    Page 1 of 1   08/16/2012     2:30:53 PM   CARE MANAGEMENT NOTE 08/16/2012  Patient:  [facility]   Account Number:  [id number]  Date Initiated:  08/16/2012  Documentation initiated by:  Chiron Campione  Subjective/Objective Assessment:   PT ADM ON 08/15/12 WITH AFIB WITH RVR S/P FALL WITH HEAD LACERATION.  PTA, PT RESIDES AT [facility], IN MEMORY CARE UNIT.     Action/Plan:   WILL CONSULT CSW TO FACILITATE RETURN TO ALF WHEN MEDICALLY STABLE.  WILL FOLLOW PROGRESS.   Anticipated DC Date:  08/17/2012   Anticipated DC Plan:  ASSISTED LIVING / REST HOME  In-house referral  Clinical Social Worker      DC Planning Services  CM consult      Choice offered to / List presented to:             Status of service:  In process, will continue to follow Medicare Important Message given?   (If response is "NO", the following Medicare IM given date fields will be blank) Date Medicare IM given:   Date Additional Medicare IM given:    Discharge Disposition:    Per UR Regulation:  Reviewed for med. necessity/level of care/duration of stay  If discussed at Long Length of Stay Meetings, dates discussed:    Comments:

## 2012-08-16 NOTE — Telephone Encounter (Signed)
New problem  Pt's daughter called to let Dr Ladona Ridgel know that patient is in the hospital due to a bad fall.

## 2012-08-16 NOTE — Progress Notes (Addendum)
[facility] PROGRESS NOTE  Hannah Bowers ZOX:[id number] DOB: 09-07-1930 DOA: 08/15/2012 PCP: Janece Canterbury, MD  Assessment/Plan: A. Fib with RVR:  - Upon admission HR in the 120-130's  requiring a cardizem gtt, she was maintained on her amiodarone.  - Last PM while on the Cardizem drip she spontaneously converted to normal sinus rhythm and developed bradycardia down to the 50s and was also noted to have some pauses and so the Cardizem drip was DC'd - She remains off the Cardizem drip this a.m. and mildly bradycardic -Her TSH is suppressed at 0.070, will decrease her Synthroid dose and follow. -I Have consulted cardiology for further recommendations. Dementia:  - Stable, continue outpatient medications  - Will monitor closely  DVT prophylaxis:  - Lovenox for prophylaxis  HTN: - BP stable  -Will cont current home regimen  - Cont to monitor  Hypothyroidism:  - As above her TSH is suppressed at 0.070, will decrease Synthroid dose. She is to followup and have function tests in 4-6 weeks for further management as appropriate. Head laceration/status post fall - Status post suturing/staples in ED Code Status: DO NOT RESUSCITATE Family Communication: Dr. [name] via phone [phone] Disposition Plan: Back to memory unit/snf when medically stable   Consultants:  Cardiology  Procedures:  none  Antibiotics:  none  HPI/Subjective: Patient denies chest pain, denies shortness of breath. She does not remember falling  Objective: Filed Vitals:   08/15/12 2028 08/16/12 0500 08/16/12 0550 08/16/12 1338  BP: 138/75  117/35 136/47  Pulse: 105  53 57  Temp: 97.9 F (36.6 C)  98.2 F (36.8 C) 98.1 F (36.7 C)  TempSrc: Oral  Oral Oral  Resp: 20  21 18   Height:      Weight:  88.95 kg (196 lb 1.6 oz) 88.95 kg (196 lb 1.6 oz)   SpO2: 97%  95% 96%    Intake/Output Summary (Last 24 hours) at 08/16/12 1344 Last data filed at 08/16/12 1221  Gross per 24 hour  Intake    980 ml   Output   1000 ml  Net    -20 ml   Filed Weights   08/15/12 1545 08/16/12 0500 08/16/12 0550  Weight: 89.404 kg (197 lb 1.6 oz) 88.95 kg (196 lb 1.6 oz) 88.95 kg (196 lb 1.6 oz)    Exam:   General: She is alert, disoriented in no apparent distress  Cardiovascular: Mild bradycardia, normal S1-S2  Respiratory: Clear to auscultation bilaterally, no crackles or wheezes  Abdomen: Soft bowel sounds present nontender nondistended no organomegaly and no masses palpable  Extremities: No cyanosis and no edema  Data Reviewed: Basic Metabolic Panel:  Recent Labs Lab 08/15/12 1250 08/15/12 1658 08/16/12 1015  NA 140  --  139  K 3.9  --  4.2  CL 104  --  105  CO2 26  --  28  GLUCOSE 103*  --  82  BUN 20  --  18  CREATININE 1.03 0.96 0.94  CALCIUM 9.4  --  9.3   Liver Function Tests:  Recent Labs Lab 08/15/12 1250 08/16/12 1015  AST 26 23  ALT 29 24  ALKPHOS 71 62  BILITOT 0.3 0.4  PROT 7.0 6.4  ALBUMIN 3.6 3.3*   No results found for this basename: LIPASE, AMYLASE,  in the last 168 hours No results found for this basename: AMMONIA,  in the last 168 hours CBC:  Recent Labs Lab 08/15/12 1250 08/15/12 1658 08/16/12 1015  WBC 7.1 7.9 6.1  NEUTROABS  4.2  --   --   HGB 13.5 13.7 12.6  HCT 39.7 40.2 38.5  MCV 89.2 89.7 91.0  PLT 191 194 176   Cardiac Enzymes:  Recent Labs Lab 08/15/12 1250  TROPONINI <0.30   BNP (last 3 results)  Recent Labs  02/01/12 1736  PROBNP 274.9   CBG: No results found for this basename: GLUCAP,  in the last 168 hours  No results found for this or any previous visit (from the past 240 hour(s)).   Studies: Ct Head Wo Contrast  08/15/2012   *RADIOLOGY REPORT*  Clinical Data:  Fall with head and neck injury.  Pain.  CT HEAD WITHOUT CONTRAST CT CERVICAL SPINE WITHOUT CONTRAST  Technique:  Multidetector CT imaging of the head and cervical spine was performed following the standard protocol without intravenous contrast.   Multiplanar CT image reconstructions of the cervical spine were also generated.  Comparison:  02/01/2012 head CT  CT HEAD  Findings: Mild generalized cerebral volume loss noted.  No acute intracranial abnormalities are identified, including mass lesion or mass effect, hydrocephalus, extra-axial fluid collection, midline shift, hemorrhage, or acute infarction.  The visualized bony calvarium is unremarkable.  A small amount of fluid within the paranasal sinuses noted.  IMPRESSION: No evidence of acute intracranial abnormality.  CT CERVICAL SPINE  Findings: Normal alignment is noted. There is no evidence of acute fracture, subluxation or prevertebral soft tissue swelling. Mild degenerative disc disease and spondylosis at C5-C6 and C6-C7 noted. No focal bony lesions are present. Soft tissue structures are unremarkable.  IMPRESSION: No static evidence of acute injury to the cervical spine.  Mild degenerative changes within the lower cervical spine.   Original Report Authenticated By: Harmon Pier, M.D.   Ct Cervical Spine Wo Contrast  08/15/2012   *RADIOLOGY REPORT*  Clinical Data:  Fall with head and neck injury.  Pain.  CT HEAD WITHOUT CONTRAST CT CERVICAL SPINE WITHOUT CONTRAST  Technique:  Multidetector CT imaging of the head and cervical spine was performed following the standard protocol without intravenous contrast.  Multiplanar CT image reconstructions of the cervical spine were also generated.  Comparison:  02/01/2012 head CT  CT HEAD  Findings: Mild generalized cerebral volume loss noted.  No acute intracranial abnormalities are identified, including mass lesion or mass effect, hydrocephalus, extra-axial fluid collection, midline shift, hemorrhage, or acute infarction.  The visualized bony calvarium is unremarkable.  A small amount of fluid within the paranasal sinuses noted.  IMPRESSION: No evidence of acute intracranial abnormality.  CT CERVICAL SPINE  Findings: Normal alignment is noted. There is no  evidence of acute fracture, subluxation or prevertebral soft tissue swelling. Mild degenerative disc disease and spondylosis at C5-C6 and C6-C7 noted. No focal bony lesions are present. Soft tissue structures are unremarkable.  IMPRESSION: No static evidence of acute injury to the cervical spine.  Mild degenerative changes within the lower cervical spine.   Original Report Authenticated By: Harmon Pier, M.D.    Scheduled Meds: . amiodarone  200 mg Oral Daily  . aspirin EC  325 mg Oral Daily  . cholecalciferol  400 Units Oral Daily  . dabigatran  75 mg Oral BID  . galantamine  24 mg Oral Q breakfast  . hydrocortisone cream  1 application Topical BID  . levothyroxine  137 mcg Oral QAC breakfast  . loratadine  10 mg Oral Daily  . LORazepam  0.5 mg Oral QHS  . pantoprazole  40 mg Oral Daily  . potassium chloride  10  mEq Oral BID  . sodium chloride  3 mL Intravenous Q12H  . triamterene-hydrochlorothiazide  1 tablet Oral Daily   Continuous Infusions:   Principal Problem:   Atrial fibrillation Active Problems:   HYPERTENSION   Diastolic heart failure    Time spent: 35    Elvie Palomo C  [facility] Pager [phone]. If 7PM-7AM, please contact night-coverage at [email or web address], password [facility] 08/16/2012, 1:44 PM  LOS: 1 day

## 2012-08-17 MED ORDER — LORAZEPAM 0.5 MG PO TABS: 0.5000 mg | ORAL_TABLET | Freq: Every day | ORAL | Status: DC

## 2012-08-17 MED ORDER — AMIODARONE HCL 200 MG PO TABS
400.0000 mg | ORAL_TABLET | Freq: Every day | ORAL | Status: DC
  Administered 2012-08-17: 400 mg via ORAL
  Filled 2012-08-17: qty 2

## 2012-08-17 MED ORDER — LORAZEPAM 0.5 MG PO TABS: 0.2500 mg | ORAL_TABLET | Freq: Two times a day (BID) | ORAL | Status: DC | PRN

## 2012-08-17 MED ORDER — HYDROCODONE-ACETAMINOPHEN 5-325 MG PO TABS: 1.0000 | ORAL_TABLET | Freq: Four times a day (QID) | ORAL | Status: DC | PRN

## 2012-08-17 MED ORDER — APIXABAN 5 MG PO TABS: 5.0000 mg | ORAL_TABLET | Freq: Two times a day (BID) | ORAL | Status: DC

## 2012-08-17 MED ORDER — APIXABAN 5 MG PO TABS
5.0000 mg | ORAL_TABLET | Freq: Two times a day (BID) | ORAL | Status: DC
  Administered 2012-08-17: 5 mg via ORAL
  Filled 2012-08-17 (×2): qty 1

## 2012-08-17 MED ORDER — LEVOTHYROXINE SODIUM 112 MCG PO TABS: 112.0000 ug | ORAL_TABLET | Freq: Every day | ORAL | Status: DC

## 2012-08-17 NOTE — Progress Notes (Signed)
   Subjective:  Denies CP or dyspnea; confused   Objective:  Filed Vitals:   08/16/12 0550 08/16/12 1338 08/16/12 1943 08/17/12 0454  BP: 117/35 136/47 153/48 119/35  Pulse: 53 57 56 51  Temp: 98.2 F (36.8 C) 98.1 F (36.7 C) 97.9 F (36.6 C) 97.9 F (36.6 C)  TempSrc: Oral Oral Oral Oral  Resp: 21 18 18 18   Height:      Weight: 196 lb 1.6 oz (88.95 kg)     SpO2: 95% 96% 96% 99%    Intake/Output from previous day:  Intake/Output Summary (Last 24 hours) at 08/17/12 0954 Last data filed at 08/16/12 1600  Gross per 24 hour  Intake    480 ml  Output      0 ml  Net    480 ml    Physical Exam: Physical exam: Well-developed well-nourished in no acute distress.  Skin is warm and dry.  HEENT with staple laceration Neck is supple.  Chest is clear to auscultation with normal expansion.  Cardiovascular exam is regular rate and rhythm.  Abdominal exam nontender or distended. No masses palpated. Extremities show no edema. neuro A and O x 1; moves all ext    Lab Results: Basic Metabolic Panel:  Recent Labs  54/09/81 1250 08/15/12 1658 08/16/12 1015  NA 140  --  139  K 3.9  --  4.2  CL 104  --  105  CO2 26  --  28  GLUCOSE 103*  --  82  BUN 20  --  18  CREATININE 1.03 0.96 0.94  CALCIUM 9.4  --  9.3   CBC:  Recent Labs  08/15/12 1250 08/15/12 1658 08/16/12 1015  WBC 7.1 7.9 6.1  NEUTROABS 4.2  --   --   HGB 13.5 13.7 12.6  HCT 39.7 40.2 38.5  MCV 89.2 89.7 91.0  PLT 191 194 176   Cardiac Enzymes:  Recent Labs  08/15/12 1250  TROPONINI <0.30     Assessment/Plan:  1 PAF - patient continues with PAF; will increase amiodarone to 400 mg by mouth daily for one month and then resume 200 mg by mouth daily. She is on aspirin and pradaxa (75 mg po BID); I am very concerned about the risk of anticoagulation given recent fall. Discontinue aspirin. Discontinue pradaxa. Treat with apixaban 5 mg po BID. If she has any further falls I would recommend  discontinuing anticoagulation and treating with aspirin. There would be a higher risk of embolic CVA but the risk of anticoagulation would most likely outweigh the benefit. She will need close followup with Dr. Ladona Ridgel following discharge. 2 low TSH-Synthroid adjusted by primary care. 3 hypertension-continue present medications.  Olga Millers 08/17/2012, 9:54 AM

## 2012-08-17 NOTE — Discharge Summary (Signed)
Physician Discharge Summary  Hannah Bowers:[id number] DOB: 05-Oct-1930 DOA: 08/15/2012  PCP: Hannah Canterbury, MD  Admit date: 08/15/2012 Discharge date: 08/17/2012  Time spent: >78minutes  Recommendations for Outpatient Follow-up:      Follow-up Information   Follow up with Hannah Bunting, MD In 1 week. (call for appt upon discharge)    Contact information:   [address] [place] [address] [phone]       Follow up with Hannah Canterbury, MD. (1-2weeks,call for appt upon discharge)     **Patient has staples on the scalp that would need to be removed at nursing facility in 5-7 days (from 6/12)  Discharge Diagnoses:  Principal Problem:   Atrial fibrillation Active Problems:   HYPERTENSION   Diastolic heart failure   Discharge Condition: improved/stable  Diet recommendation: Heart healthy  Filed Weights   08/15/12 1545 08/16/12 0500 08/16/12 0550  Weight: 89.404 kg (197 lb 1.6 oz) 88.95 kg (196 lb 1.6 oz) 88.95 kg (196 lb 1.6 oz)    History of present illness:  CECILLIA Bowers is a 79 y.o. female with a hx of afib and a documented hx of diastolic chf who presents to the hospital s/p mechanical fall resulting in a laceration to the head. CT of the head and neck were unremarkable. However, the pt was noted to have a resting HR in the upper 120's not improved with 10mg  IV cardizem. The pt was subsequently started on a cardizem gtt and the hospitalist service was consulted for possible admission.    Hospital Course:  A. Fib with RVR:  - Upon admission HR in the 120-130's requiring a cardizem gtt, she was maintained on her amiodarone.  - the PM of admission while on the Cardizem drip she spontaneously converted to normal sinus rhythm and developed bradycardia down to the 50s and was also noted to have some pauses and so the Cardizem drip was DC'd  - She remained the Cardizem drip on follow up and was mildly bradycardic  -cardiology was consulted for further  recommendations, and they saw pt and recommended no changes to her preadmission rate control meds.  -on follow up today cards/Dr Jens Som dc'ed her pradaxa and started her on Eliquis 5 bid due to concern about fall prior to admission and states that if any further falls he would recommend discontinuing anticoagulation and treating with aspirin.  - Pt is to have close follow up with Dr Ladona Ridgel upon discharge -Her TSH was checked and found to be suppressed at 0.070, and her Synthroid dose was decreased her Synthroid dose 112 mcg daily, she is to followup with her PCP and have thyroid function tests repeated in 4-6 weeks. Dementia:  - Stable, continue outpatient medications  - Will monitor closely  DVT prophylaxis:  - Lovenox for prophylaxis  HTN: - BP stable  -Will cont current home regimen  - Cont to monitor  Hypothyroidism:  - As above her TSH is suppressed at 0.070,  Synthroid dose decreased. She is to followup and have function tests in 4-6 weeks for further management as appropriate.  Head laceration/status post fall  - Status post suturing/staples in ED -the scalp staples will need to be removed at the nursing facility in 5-7 days( from 6/12)  Procedures:  Scalp Laceration sutured in ED on 6/12  Consultations:  cardiology  Discharge Exam: Filed Vitals:   08/16/12 0550 08/16/12 1338 08/16/12 1943 08/17/12 0454  BP: 117/35 136/47 153/48 119/35  Pulse: 53 57 56 51  Temp:  98.2 F (36.8 C) 98.1 F (36.7 C) 97.9 F (36.6 C) 97.9 F (36.6 C)  TempSrc: Oral Oral Oral Oral  Resp: 21 18 18 18   Height:      Weight: 88.95 kg (196 lb 1.6 oz)     SpO2: 95% 96% 96% 99%   Exam:  General: She is alert, disoriented in no apparent distress  Cardiovascular: Mild bradycardia, normal S1-S2  Respiratory: Clear to auscultation bilaterally, no crackles or wheezes  Abdomen: Soft bowel sounds present nontender nondistended no organomegaly and no masses palpable  Extremities: No cyanosis and  no edema     Discharge Instructions  Discharge Orders   Future Orders Complete By Expires     Diet - low sodium heart healthy  As directed     Increase activity slowly  As directed         Medication List    STOP taking these medications       dabigatran 75 MG Caps  Commonly known as:  PRADAXA      TAKE these medications       acetaminophen 325 MG tablet  Commonly known as:  TYLENOL  Take 650 mg by mouth every 6 (six) hours as needed for pain or fever. UAD     amiodarone 200 MG tablet  Commonly known as:  PACERONE  Take 200 mg by mouth daily.     apixaban 5 MG Tabs tablet  Commonly known as:  ELIQUIS  Take 1 tablet (5 mg total) by mouth 2 (two) times daily.     cholecalciferol 400 UNITS Tabs  Commonly known as:  VITAMIN D  Take 400 Units by mouth daily.     FLONASE 50 MCG/ACT nasal spray  Generic drug:  fluticasone  Place 2 sprays into the nose daily.     galantamine 24 MG 24 hr capsule  Commonly known as:  RAZADYNE ER  Take 24 mg by mouth daily with breakfast.     HYDROcodone-acetaminophen 5-325 MG per tablet  Commonly known as:  NORCO/VICODIN  Take 1 tablet by mouth every 6 (six) hours as needed.     hydrocortisone cream 1 %  Apply 1 application topically 2 (two) times daily. Apply to face twice daily until rash improves.     levothyroxine 112 MCG tablet  Commonly known as:  SYNTHROID, LEVOTHROID  Take 1 tablet (112 mcg total) by mouth daily before breakfast.     loratadine 10 MG tablet  Commonly known as:  CLARITIN  Take 10 mg by mouth daily.     LORazepam 0.5 MG tablet  Commonly known as:  ATIVAN  Take 0.5 tablets (0.25 mg total) by mouth 2 (two) times daily as needed for anxiety.     LORazepam 0.5 MG tablet  Commonly known as:  ATIVAN  Take 1 tablet (0.5 mg total) by mouth at bedtime.     nitroGLYCERIN 0.4 MG SL tablet  Commonly known as:  NITROSTAT  Place 0.4 mg under the tongue every 5 (five) minutes as needed for chest pain. For chest  pain     pantoprazole 40 MG tablet  Commonly known as:  PROTONIX  Take 40 mg by mouth daily.     potassium chloride 10 MEQ CR tablet  Commonly known as:  KLOR-CON  Take 10 mEq by mouth daily.     triamterene-hydrochlorothiazide 37.5-25 MG per tablet  Commonly known as:  MAXZIDE-25  Take 1 tablet by mouth daily.       No Known Allergies  Follow-up Information   Follow up with Hannah Bunting, MD In 1 week. (call for appt upon discharge)    Contact information:   [address] [place] [address] [phone]       Follow up with Hannah Canterbury, MD. (1-2weeks,call for appt upon discharge)        The results of significant diagnostics from this hospitalization (including imaging, microbiology, ancillary and laboratory) are listed below for reference.    Significant Diagnostic Studies: Ct Head Wo Contrast  08/15/2012   *RADIOLOGY REPORT*  Clinical Data:  Fall with head and neck injury.  Pain.  CT HEAD WITHOUT CONTRAST CT CERVICAL SPINE WITHOUT CONTRAST  Technique:  Multidetector CT imaging of the head and cervical spine was performed following the standard protocol without intravenous contrast.  Multiplanar CT image reconstructions of the cervical spine were also generated.  Comparison:  02/01/2012 head CT  CT HEAD  Findings: Mild generalized cerebral volume loss noted.  No acute intracranial abnormalities are identified, including mass lesion or mass effect, hydrocephalus, extra-axial fluid collection, midline shift, hemorrhage, or acute infarction.  The visualized bony calvarium is unremarkable.  A small amount of fluid within the paranasal sinuses noted.  IMPRESSION: No evidence of acute intracranial abnormality.  CT CERVICAL SPINE  Findings: Normal alignment is noted. There is no evidence of acute fracture, subluxation or prevertebral soft tissue swelling. Mild degenerative disc disease and spondylosis at C5-C6 and C6-C7 noted. No focal bony lesions are present.  Soft tissue structures are unremarkable.  IMPRESSION: No static evidence of acute injury to the cervical spine.  Mild degenerative changes within the lower cervical spine.   Original Report Authenticated By: Harmon Pier, M.D.   Ct Cervical Spine Wo Contrast  08/15/2012   *RADIOLOGY REPORT*  Clinical Data:  Fall with head and neck injury.  Pain.  CT HEAD WITHOUT CONTRAST CT CERVICAL SPINE WITHOUT CONTRAST  Technique:  Multidetector CT imaging of the head and cervical spine was performed following the standard protocol without intravenous contrast.  Multiplanar CT image reconstructions of the cervical spine were also generated.  Comparison:  02/01/2012 head CT  CT HEAD  Findings: Mild generalized cerebral volume loss noted.  No acute intracranial abnormalities are identified, including mass lesion or mass effect, hydrocephalus, extra-axial fluid collection, midline shift, hemorrhage, or acute infarction.  The visualized bony calvarium is unremarkable.  A small amount of fluid within the paranasal sinuses noted.  IMPRESSION: No evidence of acute intracranial abnormality.  CT CERVICAL SPINE  Findings: Normal alignment is noted. There is no evidence of acute fracture, subluxation or prevertebral soft tissue swelling. Mild degenerative disc disease and spondylosis at C5-C6 and C6-C7 noted. No focal bony lesions are present. Soft tissue structures are unremarkable.  IMPRESSION: No static evidence of acute injury to the cervical spine.  Mild degenerative changes within the lower cervical spine.   Original Report Authenticated By: Harmon Pier, M.D.    Microbiology: No results found for this or any previous visit (from the past 240 hour(s)).   Labs: Basic Metabolic Panel:  Recent Labs Lab 08/15/12 1250 08/15/12 1658 08/16/12 1015  NA 140  --  139  K 3.9  --  4.2  CL 104  --  105  CO2 26  --  28  GLUCOSE 103*  --  82  BUN 20  --  18  CREATININE 1.03 0.96 0.94  CALCIUM 9.4  --  9.3   Liver Function  Tests:  Recent Labs Lab 08/15/12 1250 08/16/12  1015  AST 26 23  ALT 29 24  ALKPHOS 71 62  BILITOT 0.3 0.4  PROT 7.0 6.4  ALBUMIN 3.6 3.3*   No results found for this basename: LIPASE, AMYLASE,  in the last 168 hours No results found for this basename: AMMONIA,  in the last 168 hours CBC:  Recent Labs Lab 08/15/12 1250 08/15/12 1658 08/16/12 1015  WBC 7.1 7.9 6.1  NEUTROABS 4.2  --   --   HGB 13.5 13.7 12.6  HCT 39.7 40.2 38.5  MCV 89.2 89.7 91.0  PLT 191 194 176   Cardiac Enzymes:  Recent Labs Lab 08/15/12 1250  TROPONINI <0.30   BNP: BNP (last 3 results)  Recent Labs  02/01/12 1736  PROBNP 274.9   CBG: No results found for this basename: GLUCAP,  in the last 168 hours     Signed:  Angelle Isais C  [facility] 08/17/2012, 10:37 AM

## 2012-08-17 NOTE — Progress Notes (Signed)
Patient for d/c today back to [facility]- Family and patient agreeable to this plan-daughter to transport- plans confirmed with ALF- Reece Levy, MSW, LCSWA [phone]/weekend coverage

## 2012-08-27 ENCOUNTER — Encounter: Payer: Self-pay | Admitting: Internal Medicine

## 2012-08-27 ENCOUNTER — Ambulatory Visit (INDEPENDENT_AMBULATORY_CARE_PROVIDER_SITE_OTHER): Payer: Medicare Other | Admitting: Internal Medicine

## 2012-08-27 VITALS — BP 183/60 | HR 57 | Wt 198.8 lb

## 2012-08-27 DIAGNOSIS — I10 Essential (primary) hypertension: Secondary | ICD-10-CM

## 2012-08-27 DIAGNOSIS — I4891 Unspecified atrial fibrillation: Secondary | ICD-10-CM

## 2012-08-27 MED ORDER — METOPROLOL TARTRATE 25 MG PO TABS: 25.0000 mg | ORAL_TABLET | Freq: Two times a day (BID) | ORAL | Status: DC

## 2012-08-27 NOTE — Progress Notes (Signed)
HPI Hannah Bowers returns today for followup. She is a very pleasant 79 year old woman with a history of dementia, symptomatic bradycardia, paroxysmal, and now persistent atrial fibrillation, and recurrent falls. The patient has been on anticoagulation but in the setting of multiple and recurrent falls is of questionable candidacy. She has no specific complaints date 9 chest pain or shortness of breath. Her daughter who is with her today notes that her memory is poor and she is easily confused. She has dementia. No Known Allergies   Current Outpatient Prescriptions  Medication Sig Dispense Refill  . acetaminophen (TYLENOL) 325 MG tablet Take 650 mg by mouth every 6 (six) hours as needed for pain or fever. UAD      . cholecalciferol (VITAMIN D) 400 UNITS TABS Take 400 Units by mouth daily.      . fluticasone (FLONASE) 50 MCG/ACT nasal spray Place 2 sprays into the nose daily.        Marland Kitchen galantamine (RAZADYNE ER) 24 MG 24 hr capsule Take 24 mg by mouth daily with breakfast.        . HYDROcodone-acetaminophen (NORCO/VICODIN) 5-325 MG per tablet Take 1 tablet by mouth every 6 (six) hours as needed.  30 tablet  0  . hydrocortisone cream 1 % Apply 1 application topically 2 (two) times daily. Apply to face twice daily until rash improves.      Marland Kitchen levothyroxine (SYNTHROID, LEVOTHROID) 112 MCG tablet Take 1 tablet (112 mcg total) by mouth daily before breakfast.  30 tablet  0  . loratadine (CLARITIN) 10 MG tablet Take 10 mg by mouth daily.        Marland Kitchen LORazepam (ATIVAN) 0.5 MG tablet Take 1 tablet (0.5 mg total) by mouth at bedtime.  30 tablet  0  . nitroGLYCERIN (NITROSTAT) 0.4 MG SL tablet Place 0.4 mg under the tongue every 5 (five) minutes as needed for chest pain. For chest pain      . pantoprazole (PROTONIX) 40 MG tablet Take 40 mg by mouth daily.        . potassium chloride (KLOR-CON) 10 MEQ CR tablet Take 10 mEq by mouth daily.        Marland Kitchen triamterene-hydrochlorothiazide (MAXZIDE-25) 37.5-25 MG per tablet Take  1 tablet by mouth daily.      Marland Kitchen apixaban (ELIQUIS) 5 MG TABS tablet Take 1 tablet (5 mg total) by mouth 2 (two) times daily.  60 tablet  0  . metoprolol tartrate (LOPRESSOR) 25 MG tablet Take 1 tablet (25 mg total) by mouth 2 (two) times daily.  180 tablet  3   No current facility-administered medications for this visit.     Past Medical History  Diagnosis Date  . Hypertension   . Atrial flutter     paroxysmal  . GERD (gastroesophageal reflux disease)   . Dementia   . COPD (chronic obstructive pulmonary disease)   . Shortness of breath   . Transient ischemic attack (TIA)   . Arthritis     ROS:   All systems reviewed and negative except as noted in the HPI.   Past Surgical History  Procedure Laterality Date  . Radiofrequency ablation  2001  . Orif ankle fracture  02/01/2012    Procedure: OPEN REDUCTION INTERNAL FIXATION (ORIF) ANKLE FRACTURE;  Surgeon: Emilee Hero, MD;  Location: [facility];  Service: Orthopedics;  Laterality: Left;     Family History  Problem Relation Age of Onset  . Alzheimer's disease Father     dementia  . Other Mother  possible MS     History   Social History  . Marital Status: Married    Spouse Name: N/A    Number of Children: N/A  . Years of Education: N/A   Occupational History  . Not on file.   Social History Main Topics  . Smoking status: Former Smoker -- 0.25 packs/day for 4 years    Quit date: 01/31/1978  . Smokeless tobacco: Never Used  . Alcohol Use: No  . Drug Use: No  . Sexually Active: Not on file   Other Topics Concern  . Not on file   Social History Narrative  . No narrative on file     BP 183/60  Pulse 57  Wt 198 lb 12.8 oz (90.175 kg)  BMI 33.08 kg/m2  Physical Exam:  elderly appearing 79 year old woman, NAD HEENT: Unremarkable Neck:  No JVD, no thyromegally Lungs:  Clear with no wheezes, rales, or rhonchi. HEART:  IRegular rate rhythm, no murmurs, no rubs, no clicks Abd:  soft, positive  bowel sounds, no organomegally, no rebound, no guarding Ext:  2 plus pulses, no edema, no cyanosis, no clubbing Skin:  No rashes no nodules Neuro:  CN II through XII intact, motor grossly intact   DEVICE  Normal device function.  See PaceArt for details.   Assess/Plan:

## 2012-08-27 NOTE — Assessment & Plan Note (Signed)
Her systolic blood pressure is elevated today. I've asked the patient to reduce her salt intake. She will continue her diuretic therapy. Hopefully with the addition of her beta blocker, her blood pressure will improve.

## 2012-08-27 NOTE — Assessment & Plan Note (Signed)
She does have a rapid ventricular rate and atrial fibrillation. At this point attempts at rhythm control or futile. For this reason we will stop amiodarone. In addition, her multiple falls maker he anticoagulation non-candidate. She will stop her systemic anticoagulation at this time.

## 2012-08-27 NOTE — Patient Instructions (Addendum)
Your physician wants you to follow-up in: 6 months with Dr Court Joy will receive a reminder letter in the mail two months in advance. If you don't receive a letter, please call our office to schedule the follow-up appointment  Your physician has recommended you make the following change in your medication:  1) Stop Pradaxa-- due to falls 2) Stop Amiodarone 3) Start Metoprolol 25mg  twice daily

## 2012-10-20 ENCOUNTER — Encounter (HOSPITAL_COMMUNITY): Payer: Self-pay | Admitting: Orthopedic Surgery

## 2013-01-14 ENCOUNTER — Ambulatory Visit (INDEPENDENT_AMBULATORY_CARE_PROVIDER_SITE_OTHER): Payer: Medicare Other | Admitting: Internal Medicine

## 2013-01-14 ENCOUNTER — Encounter: Payer: Self-pay | Admitting: Internal Medicine

## 2013-01-14 VITALS — BP 171/73 | Ht 66.0 in | Wt 203.0 lb

## 2013-01-14 DIAGNOSIS — I5030 Unspecified diastolic (congestive) heart failure: Secondary | ICD-10-CM

## 2013-01-14 DIAGNOSIS — I4891 Unspecified atrial fibrillation: Secondary | ICD-10-CM

## 2013-01-14 DIAGNOSIS — I10 Essential (primary) hypertension: Secondary | ICD-10-CM

## 2013-01-14 DIAGNOSIS — I4892 Unspecified atrial flutter: Secondary | ICD-10-CM

## 2013-01-14 NOTE — Assessment & Plan Note (Signed)
Her heart failure symptoms appear to be well compensated, class IIA. I've encouraged the patient to continue a low-sodium diet.

## 2013-01-14 NOTE — Progress Notes (Signed)
HPI Mrs. Degan returns today for followup. She is a very pleasant 79 year old woman with a history of dementia, symptomatic bradycardia, paroxysmal, and now persistent atrial fibrillation, and recurrent falls. The patient has been on anticoagulation but in the setting of multiple and recurrent falls is of questionable candidacy. She has no specific complaints denying chest pain or shortness of breath. Her daughter who is with her today notes that her memory is poor and she is easily confused. She has dementia. No Known Allergies   Current Outpatient Prescriptions  Medication Sig Dispense Refill  . acetaminophen (TYLENOL) 325 MG tablet Take 650 mg by mouth every 6 (six) hours as needed for pain or fever. UAD      . cholecalciferol (VITAMIN D) 400 UNITS TABS Take 400 Units by mouth daily.      Marland Kitchen ENSURE (ENSURE) Take 237 mLs by mouth as needed.      . fluticasone (FLONASE) 50 MCG/ACT nasal spray Place 2 sprays into the nose daily.        Marland Kitchen galantamine (RAZADYNE ER) 24 MG 24 hr capsule Take 24 mg by mouth daily with breakfast.        . HYDROcodone-acetaminophen (NORCO/VICODIN) 5-325 MG per tablet Take 1 tablet by mouth every 6 (six) hours as needed.  30 tablet  0  . hydrocortisone cream 1 % Apply 1 application topically 2 (two) times daily. Apply to face twice daily until rash improves.      Marland Kitchen levothyroxine (SYNTHROID, LEVOTHROID) 112 MCG tablet Take 1 tablet (112 mcg total) by mouth daily before breakfast.  30 tablet  0  . loratadine (CLARITIN) 10 MG tablet Take 10 mg by mouth daily.        Marland Kitchen LORazepam (ATIVAN) 0.5 MG tablet Take 1 tablet (0.5 mg total) by mouth at bedtime.  30 tablet  0  . metoprolol tartrate (LOPRESSOR) 25 MG tablet Take 1 tablet (25 mg total) by mouth 2 (two) times daily.  180 tablet  3  . nitroGLYCERIN (NITROSTAT) 0.4 MG SL tablet Place 0.4 mg under the tongue every 5 (five) minutes as needed for chest pain. For chest pain      . pantoprazole (PROTONIX) 40 MG tablet Take 40 mg by  mouth daily.        . potassium chloride (KLOR-CON) 10 MEQ CR tablet Take 10 mEq by mouth daily.        Marland Kitchen triamcinolone cream (KENALOG) 0.1 % Apply 1 application topically as needed.      . triamterene-hydrochlorothiazide (MAXZIDE-25) 37.5-25 MG per tablet Take 1 tablet by mouth daily.      Marland Kitchen apixaban (ELIQUIS) 5 MG TABS tablet Take 1 tablet (5 mg total) by mouth 2 (two) times daily.  60 tablet  0   No current facility-administered medications for this visit.     Past Medical History  Diagnosis Date  . Hypertension   . Atrial flutter     paroxysmal  . GERD (gastroesophageal reflux disease)   . Dementia   . COPD (chronic obstructive pulmonary disease)   . Shortness of breath   . Transient ischemic attack (TIA)   . Arthritis     ROS:   All systems reviewed and negative except as noted in the HPI.   Past Surgical History  Procedure Laterality Date  . Radiofrequency ablation  2001  . Orif ankle fracture Left 02/01/2012    Procedure: OPEN REDUCTION INTERNAL FIXATION (ORIF) ANKLE FRACTURE;  Surgeon: Emilee Hero, MD;  Location: [facility];  Service: Orthopedics;  Laterality: Left;     Family History  Problem Relation Age of Onset  . Alzheimer's disease Father     dementia  . Other Mother     possible MS     History   Social History  . Marital Status: Married    Spouse Name: N/A    Number of Children: N/A  . Years of Education: N/A   Occupational History  . Not on file.   Social History Main Topics  . Smoking status: Former Smoker -- 0.25 packs/day for 4 years    Quit date: 01/31/1978  . Smokeless tobacco: Never Used  . Alcohol Use: No  . Drug Use: No  . Sexual Activity: Not on file   Other Topics Concern  . Not on file   Social History Narrative  . No narrative on file     BP 171/73  Ht 5\' 6"  (1.676 m)  Wt 203 lb (92.08 kg)  BMI 32.78 kg/m2  Physical Exam:  elderly appearing 79 year old woman, NAD HEENT: Unremarkable Neck:  No JVD, no  thyromegally Lungs:  Clear with no wheezes, rales, or rhonchi. HEART:  IRegular rate rhythm, no murmurs, no rubs, no clicks Abd:  soft, positive bowel sounds, no organomegally, no rebound, no guarding Ext:  2 plus pulses, no edema, no cyanosis, no clubbing Skin:  No rashes no nodules Neuro:  CN II through XII intact, motor grossly intact    Assess/Plan:

## 2013-01-14 NOTE — Assessment & Plan Note (Signed)
Her blood pressure is elevated today. Previously, it is been under better control. We will follow this and I have reminded the patient to reduce her salt intake.

## 2013-01-14 NOTE — Addendum Note (Signed)
Addended by: Awilda Bill on: 01/14/2013 03:35 PM   Modules accepted: Orders

## 2013-01-14 NOTE — Assessment & Plan Note (Signed)
On exam today, the patient appears to be in normal rhythm as she is regular. She is no longer on anticoagulation. She will continue her current medical therapy.

## 2013-01-14 NOTE — Patient Instructions (Signed)
Your physician recommends that you continue on your current medications as directed. Please refer to the Current Medication list given to you today.  Your physician wants you to follow-up in: 1 year with Dr. Taylor.  You will receive a reminder letter in the mail two months in advance. If you don't receive a letter, please call our office to schedule the follow-up appointment.  

## 2013-06-25 ENCOUNTER — Encounter (HOSPITAL_COMMUNITY): Payer: Self-pay | Admitting: Emergency Medicine

## 2013-06-25 ENCOUNTER — Emergency Department (HOSPITAL_COMMUNITY)
Admission: EM | Admit: 2013-06-25 | Discharge: 2013-06-25 | Disposition: A | Discharge status: Home / Self Care | Payer: Medicare Other | Attending: Emergency Medicine | Admitting: Emergency Medicine

## 2013-06-25 DIAGNOSIS — I10 Essential (primary) hypertension: Secondary | ICD-10-CM | POA: Insufficient documentation

## 2013-06-25 DIAGNOSIS — Z79899 Other long term (current) drug therapy: Secondary | ICD-10-CM | POA: Insufficient documentation

## 2013-06-25 DIAGNOSIS — M129 Arthropathy, unspecified: Secondary | ICD-10-CM | POA: Insufficient documentation

## 2013-06-25 DIAGNOSIS — Z792 Long term (current) use of antibiotics: Secondary | ICD-10-CM | POA: Insufficient documentation

## 2013-06-25 DIAGNOSIS — Z87891 Personal history of nicotine dependence: Secondary | ICD-10-CM | POA: Insufficient documentation

## 2013-06-25 DIAGNOSIS — J4489 Other specified chronic obstructive pulmonary disease: Secondary | ICD-10-CM | POA: Insufficient documentation

## 2013-06-25 DIAGNOSIS — F0390 Unspecified dementia without behavioral disturbance: Secondary | ICD-10-CM | POA: Insufficient documentation

## 2013-06-25 DIAGNOSIS — IMO0002 Reserved for concepts with insufficient information to code with codable children: Secondary | ICD-10-CM | POA: Insufficient documentation

## 2013-06-25 DIAGNOSIS — J449 Chronic obstructive pulmonary disease, unspecified: Secondary | ICD-10-CM | POA: Insufficient documentation

## 2013-06-25 DIAGNOSIS — K219 Gastro-esophageal reflux disease without esophagitis: Secondary | ICD-10-CM | POA: Insufficient documentation

## 2013-06-25 DIAGNOSIS — I4892 Unspecified atrial flutter: Secondary | ICD-10-CM

## 2013-06-25 DIAGNOSIS — Z8673 Personal history of transient ischemic attack (TIA), and cerebral infarction without residual deficits: Secondary | ICD-10-CM | POA: Insufficient documentation

## 2013-06-25 NOTE — Discharge Instructions (Signed)
Atrial Flutter °Atrial flutter is a heart rhythm that can cause the heart to beat very fast (tachycardia). It originates in the upper chambers of the heart (atria). In atrial flutter, the top chambers of the heart (atria) often beat much faster than the bottom chambers of the heart (ventricles). Atrial flutter has a regular "saw toothed" appearance in an EKG readout. An EKG is a test that records the electrical activity of the heart. Atrial flutter can cause the heart to beat up to 150 beats per minute (BPM). Atrial flutter can either be short lived (paroxysmal) or permanent.  °CAUSES  °Causes of atrial flutter can be many. Some of these include: °· Heart related issues: °· Heart attack (myocardial infarction). °· Heart failure. °· Heart valve problems. °· Poorly controlled high blood pressure (hypertension). °· Afteropen heart surgery. °· Lung related issues: °· A blood clot in the lungs (pulmonary embolism). °· Chronic obstructive pulmonary disease (COPD). Medications used to treat COPD can attribute to atrial flutter. °· Other related causes: °· Hyperthyroidism. °· Caffeine. °· Some decongestant cold medications. °· Low electrolyte levels such as potassium or magnesium. °· Cocaine. °SYMPTOMS °· An awareness of your heart beating rapidly (palpitations). °· Shortness of breath. °· Chest pain. °· Low blood pressure (hypotension). °· Dizziness or fainting. °DIAGNOSIS  °Different tests can be performed to diagnose atrial flutter.  °· An EKG. °· Holter monitor. This is a 24 hour recording of your heart rhythm. You will also be given a diary. Write down all symptoms that you have and what you were doing at the time you experienced symptoms. °· Cardiac event monitor. This small device can be worn for up to 30 days. When you have heart symptoms, you will push a button on the device. This will then record your heart rhythm. °· Echocardiogram. This is an imaging test to look at your heart. Your caregiver will look at your  heart valves and the ventricles. °· Stress Test. This test can help determine if the atrial flutter is related to exercise or if coronary artery disease is present. °· Laboratory studies will look at certain blood levels like: °· Complete blood count (CBC). °· Potassium. °· Magnesium. °· Thyroid function. °TREATMENT  °Treatment of atrial flutter varies. A combination of therapies may be used or sometimes atrial flutter may need only 1 type of treatment.  °Lab work: °If your blood work, such as your electrolytes (potassium, magnesium) or your thyroid function tests are abnormal, your caregiver will treat them accordingly.  °Medication:  °There are several different types of medications that can convert your heart to a normal rhythm and prevent atrial flutter from reoccurring.  °Nonsurgical procedures: °Nonsurgical techniques may be used to control atrial flutter. Some examples include: °· Cardioversion. This technique uses either drugs or an electrical shock to restore a normal heart rhythm: °· Cardioversion drugs may be given through an intravenous (IV) line to help "reset" the heart rhythm. °· In electrical cardioversion, your caregiver shocks your heart with electrical energy. This helps to reset the heartbeat to a normal rhythm. °· Ablation. If atrial flutter is a persistent problem, an ablation may be needed. This procedure is done under mild sedation. High frequency radio-wave energy is used to destroy the area of heart tissue responsible for atrial flutter. °SEEK IMMEDIATE MEDICAL CARE IF:  °· Dizziness. °· Near fainting or fainting. °· Shortness of breath. °· Chest pain or pressure. °· Sudden nausea or vomiting. °· Profuse sweating. °If you have the above symptoms, call your   local emergency service immediately! Do not drive yourself to the hospital. °MAKE SURE YOU:  °· Understand these instructions. °· Will watch your condition. °· Will get help right away if you are not doing well or get worse. °Document  Released: 07/09/2008 Document Revised: 05/15/2011 Document Reviewed: 07/09/2008 °[employer]® Patient Information ©2014 [employer]. ° °

## 2013-06-25 NOTE — ED Provider Notes (Signed)
CSN: [id number]     Arrival date & time 06/25/13  1741 History   First MD Initiated Contact with Patient 06/25/13 1742     Chief Complaint  Patient presents with  . Atrial Fibrillation     (Consider location/radiation/quality/duration/timing/severity/associated sxs/prior Treatment) HPI  This is a 80 y.o. female with PMH of HTN, presenting with tachycardia. [facility]set earlier [facility], [facility].  Duration of symptoms unknown, as she is asymptomatic at this time. Diltiazem given in transit. Patient denies shortness of breath, chest pain weakness, numbness, tingling at this time.  Past Medical History  Diagnosis Date  . Hypertension   . Atrial flutter     paroxysmal  . GERD (gastroesophageal reflux disease)   . Dementia   . COPD (chronic obstructive pulmonary disease)   . Shortness of breath   . Transient ischemic attack (TIA)   . Arthritis    Past Surgical History  Procedure Laterality Date  . Radiofrequency ablation  2001  . Orif ankle fracture Left 02/01/2012    Procedure: OPEN REDUCTION INTERNAL FIXATION (ORIF) ANKLE FRACTURE;  Surgeon: Emilee HeroMark Leonard Dumonski, MD;  Location: [facility];  Service: Orthopedics;  Laterality: Left;   Family History  Problem Relation Age of Onset  . Alzheimer's disease Father     dementia  . Other Mother     possible MS   History  Substance Use Topics  . Smoking status: Former Smoker -- 0.25 packs/day for 4 years    Quit date: 01/31/1978  . Smokeless tobacco: Never Used  . Alcohol Use: No   OB History   Grav Para Term Preterm Abortions TAB SAB Ect Mult Living                 Review of Systems  Constitutional: Negative for fever and chills.  HENT: Negative for facial swelling.   Eyes: Negative for photophobia and pain.  Respiratory: Negative for cough and shortness of breath.   Cardiovascular: Negative for leg swelling.  Gastrointestinal: Negative for nausea, vomiting and abdominal pain.  Genitourinary: Negative for dysuria.   Musculoskeletal: Negative for arthralgias.  Skin: Negative for rash and wound.  Neurological: Negative for seizures.  Hematological: Negative for adenopathy.      Allergies  Review of patient's allergies indicates no known allergies.  Home Medications   Prior to Admission medications   Medication Sig Start Date End Date Taking? Authorizing Provider  acetaminophen (TYLENOL) 325 MG tablet Take 650 mg by mouth every 6 (six) hours as needed for pain or fever. UAD    Historical Provider, MD  cholecalciferol (VITAMIN D) 400 UNITS TABS Take 400 Units by mouth daily.    Historical Provider, MD  ENSURE (ENSURE) Take 237 mLs by mouth as needed.    Historical Provider, MD  galantamine (RAZADYNE ER) 24 MG 24 hr capsule Take 24 mg by mouth daily with breakfast.      Historical Provider, MD  HYDROcodone-acetaminophen (NORCO/VICODIN) 5-325 MG per tablet Take 1 tablet by mouth every 6 (six) hours as needed. 08/17/12   Kela MillinAdeline C Viyuoh, MD  levothyroxine (SYNTHROID, LEVOTHROID) 112 MCG tablet Take 1 tablet (112 mcg total) by mouth daily before breakfast. 08/17/12   Kela MillinAdeline C Viyuoh, MD  loratadine (CLARITIN) 10 MG tablet Take 10 mg by mouth daily.      Historical Provider, MD  LORazepam (ATIVAN) 0.5 MG tablet Take 1 tablet (0.5 mg total) by mouth at bedtime. 08/17/12   Kela MillinAdeline C Viyuoh, MD  metoprolol tartrate (LOPRESSOR) 25 MG tablet  Take 1 tablet (25 mg total) by mouth 2 (two) times daily. 08/27/12   Marinus Maw, MD  metroNIDAZOLE (METROGEL) 0.75 % gel Apply 1 application topically daily.    Historical Provider, MD  nitroGLYCERIN (NITROSTAT) 0.4 MG SL tablet Place 0.4 mg under the tongue every 5 (five) minutes as needed for chest pain. For chest pain    Historical Provider, MD  pantoprazole (PROTONIX) 40 MG tablet Take 40 mg by mouth daily.      Historical Provider, MD  potassium chloride (KLOR-CON) 10 MEQ CR tablet Take 10 mEq by mouth daily.      Historical Provider, MD  triamcinolone cream (KENALOG)  0.1 % Apply 1 application topically as needed.    Historical Provider, MD  triamterene-hydrochlorothiazide (MAXZIDE-25) 37.5-25 MG per tablet Take 1 tablet by mouth daily.    Historical Provider, MD   BP 154/108  Pulse 94  Resp 18  Ht 5\' 7"  (1.702 m)  Wt 203 lb (92.08 kg)  BMI 31.79 kg/m2  SpO2 96% Physical Exam  Constitutional: She is oriented to person, place, and time. She appears well-developed and well-nourished. No distress.  HENT:  Head: Normocephalic and atraumatic.  Mouth/Throat: No oropharyngeal exudate.  Eyes: Conjunctivae are normal. Pupils are equal, round, and reactive to light. No scleral icterus.  Neck: Normal range of motion. No tracheal deviation present. No thyromegaly present.  Cardiovascular: Normal heart sounds.  Exam reveals no gallop and no friction rub.   No murmur heard. Irregular rhythm  Pulmonary/Chest: Effort normal and breath sounds normal. No stridor. No respiratory distress. She has no wheezes. She has no rales. She exhibits no tenderness.  Abdominal: Soft. She exhibits no distension and no mass. There is no tenderness. There is no rebound and no guarding.  Musculoskeletal: Normal range of motion. She exhibits no edema.  Neurological: She is alert and oriented to person, place, and time.  Skin: Skin is warm and dry. She is not diaphoretic.    ED Course  Procedures (including critical care time)  MDM   Final diagnoses:  None    This is a 80 y.o. female with PMH of HTN, presenting with tachycardia. Onset earlier today, at [facility].  Duration of symptoms unknown, as she is asymptomatic at this time. Diltiazem given in transit. Patient denies shortness of breath, chest pain weakness, numbness, tingling at this time.  Exam reveals normal heart and lung sounds,   Recent cardiology notes reflect that she is no longer on antiarrhythmic medication and is no longer anticoagulated as she is a fall risk. The daughter is at bedside, very  attentive and helpful. Heart rate is currently fluctuating between 80 and 115 beats per minute. The daughter states that this is baseline for the patient, and this has been her condition on every presentation to her cardiology appointments.   Date: 06/25/2013  Rate: 91  Rhythm: atrial flutter  QRS Axis: normal  Intervals: normal  ST/T Wave abnormalities: nonspecific ST changes  Conduction Disutrbances:none  Narrative Interpretation:  A flutter, rate of 91  Old EKG Reviewed: largely unchanged, except for slower rate today  As this is baseline for patient, and the patient has no current complaints, I do not believe further workup for ACS, PE, PTX, PNA, pericarditis, tamponade, dissection is indicated at this time. The daughter does not desire further workup and feels as though her mother would benefit from discharge back to the skilled nursing facility. I agree with this. Pt stable for discharge, FU.  All  questions answered.  Return precautions given.  I have discussed case and care has been guided by my attending physician, Dr. Patria Maneampos.  Loma BostonStirling Lashante Fryberger, MD 06/26/13 [phone]

## 2013-06-25 NOTE — ED Notes (Signed)
Pt to department via EMS [facility]- pt c/o chest pain to the staff at the NH, pt was given 3 nitro without and pain relief. Pressure that radiated to the left arm. Pt noted to be at a rate 200 and given 20mg  of Cardizem. Pt with dementia. Pt is a DNR. Bp-129/72 Hr-103

## 2013-06-28 NOTE — ED Provider Notes (Signed)
I saw and evaluated the patient, reviewed the resident's note and I agree with the findings and plan.  I personally evaluated the ECG and agree with the interpretation of the resident  Well appearing. HCPOA does not want additional workup. Dc back to facility at this time per family wishes. MSE complete    Lyanne CoKevin M Juana Haralson, MD 06/28/13 [phone]

## 2013-08-11 ENCOUNTER — Telehealth: Payer: Self-pay | Admitting: Internal Medicine

## 2013-08-11 NOTE — Telephone Encounter (Signed)
Spoke with pt's daughter Beverley Fiedler regarding pt's medication Dr. Ladona Ridgel prescribed. Question was clarified to daughter.

## 2013-08-11 NOTE — Telephone Encounter (Signed)
New problem   Pt's daughter need to speak to nurse concerning pt's medication she is suppose to be taking. Need to know if she is taking correct medication.

## 2013-08-26 ENCOUNTER — Other Ambulatory Visit: Payer: Self-pay

## 2013-08-27 ENCOUNTER — Ambulatory Visit (INDEPENDENT_AMBULATORY_CARE_PROVIDER_SITE_OTHER): Payer: Medicare Other | Admitting: Internal Medicine

## 2013-08-27 ENCOUNTER — Encounter: Payer: Self-pay | Admitting: Internal Medicine

## 2013-08-27 VITALS — BP 140/90 | HR 147 | Ht 66.0 in | Wt 200.0 lb

## 2013-08-27 DIAGNOSIS — I4891 Unspecified atrial fibrillation: Secondary | ICD-10-CM

## 2013-08-27 DIAGNOSIS — I10 Essential (primary) hypertension: Secondary | ICD-10-CM

## 2013-08-27 DIAGNOSIS — I482 Chronic atrial fibrillation, unspecified: Secondary | ICD-10-CM

## 2013-08-27 DIAGNOSIS — I5032 Chronic diastolic (congestive) heart failure: Secondary | ICD-10-CM

## 2013-08-27 MED ORDER — METOPROLOL TARTRATE 50 MG PO TABS: 50.0000 mg | ORAL_TABLET | Freq: Two times a day (BID) | ORAL | Status: DC

## 2013-08-27 NOTE — Patient Instructions (Signed)
Your physician has recommended you make the following change in your medication:  1. INCREASE METOPROLOL TO 50 MG TWICE DAILY  YOU WILL NEED A NURSE VISIT TO BE DONE IN 2 WEEKS FOR AN EKG DUE TO INCREASE IN METOPROLOL  Your physician wants you to follow-up in: 6 MONTHS WITH DR. Ladona RidgelAYLOR. You will receive a reminder letter in the mail two months in advance. If you don't receive a letter, please call our office to schedule the follow-up appointment.

## 2013-08-28 ENCOUNTER — Encounter: Payer: Self-pay | Admitting: Internal Medicine

## 2013-08-28 NOTE — Progress Notes (Signed)
HPI Hannah Bowers returns today for followup. She is a very pleasant 80 year old woman with a history of dementia, symptomatic bradycardia, paroxysmal, and now persistent atrial fibrillation, and recurrent falls. The patient has been on anticoagulation but in the setting of multiple and recurrent falls is of questionable candidacy. She has no specific complaints denying chest pain or shortness of breath. Her ventricular rate is not well controlled. Her daughter who is with her today notes that her memory is poor and she is easily confused. She has dementia. No Known Allergies   Current Outpatient Prescriptions  Medication Sig Dispense Refill  . acetaminophen (TYLENOL) 325 MG tablet Take 650 mg by mouth every 6 (six) hours as needed for pain or fever. UAD      . cholecalciferol (VITAMIN D) 400 UNITS TABS Take 400 Units by mouth daily.      Marland Kitchen. HYDROcodone-acetaminophen (NORCO/VICODIN) 5-325 MG per tablet Take 1 tablet by mouth every 6 (six) hours as needed.  30 tablet  0  . levothyroxine (SYNTHROID, LEVOTHROID) 112 MCG tablet Take 1 tablet (112 mcg total) by mouth daily before breakfast.  30 tablet  0  . loratadine (CLARITIN) 10 MG tablet Take 10 mg by mouth daily.        Marland Kitchen. LORazepam (ATIVAN) 0.5 MG tablet Take 1 tablet (0.5 mg total) by mouth at bedtime.  30 tablet  0  . metoprolol tartrate (LOPRESSOR) 50 MG tablet Take 1 tablet (50 mg total) by mouth 2 (two) times daily.  180 tablet  3  . metroNIDAZOLE (METROGEL) 0.75 % gel Apply 1 application topically daily.      . nitroGLYCERIN (NITROSTAT) 0.4 MG SL tablet Place 0.4 mg under the tongue every 5 (five) minutes as needed for chest pain. For chest pain      . pantoprazole (PROTONIX) 40 MG tablet Take 40 mg by mouth daily.        . potassium chloride (KLOR-CON) 10 MEQ CR tablet Take 10 mEq by mouth daily.        Marland Kitchen. triamcinolone cream (KENALOG) 0.1 % Apply 1 application topically as needed.      . triamterene-hydrochlorothiazide (MAXZIDE-25) 37.5-25 MG  per tablet Take 1 tablet by mouth daily.      Marland Kitchen. galantamine (RAZADYNE ER) 24 MG 24 hr capsule Take 24 mg by mouth daily with breakfast.         No current facility-administered medications for this visit.     Past Medical History  Diagnosis Date  . Hypertension   . Atrial flutter     paroxysmal  . GERD (gastroesophageal reflux disease)   . Dementia   . COPD (chronic obstructive pulmonary disease)   . Shortness of breath   . Transient ischemic attack (TIA)   . Arthritis     ROS:   All systems reviewed and negative except as noted in the HPI.   Past Surgical History  Procedure Laterality Date  . Radiofrequency ablation  2001  . Orif ankle fracture Left 02/01/2012    Procedure: OPEN REDUCTION INTERNAL FIXATION (ORIF) ANKLE FRACTURE;  Surgeon: Emilee HeroMark Leonard Dumonski, MD;  Location: [facility];  Service: Orthopedics;  Laterality: Left;     Family History  Problem Relation Age of Onset  . Alzheimer's disease Father     dementia  . Other Mother     possible MS     History   Social History  . Marital Status: Married    Spouse Name: N/A    Number of Children: N/A  .  Years of Education: N/A   Occupational History  . Not on file.   Social History Main Topics  . Smoking status: Former Smoker -- 0.25 packs/day for 4 years    Quit date: 01/31/1978  . Smokeless tobacco: Never Used  . Alcohol Use: No  . Drug Use: No  . Sexual Activity: Not on file   Other Topics Concern  . Not on file   Social History Narrative  . No narrative on file     BP 140/90  Pulse 147  Ht 5\' 6"  (1.676 m)  Wt 200 lb (90.719 kg)  BMI 32.30 kg/m2  Physical Exam:  elderly appearing 80 year old woman, NAD HEENT: Unremarkable Neck:  No JVD, no thyromegally Lungs:  Clear with no wheezes, rales, or rhonchi. HEART:  IRegular rate rhythm, no murmurs, no rubs, no clicks Abd:  soft, positive bowel sounds, no organomegally, no rebound, no guarding Ext:  2 plus pulses, no edema, no cyanosis, no  clubbing Skin:  No rashes no nodules Neuro:  CN II through XII intact, motor grossly intact, she is pleasantly confused  ECG - atrial fib with a rapid ventricular rate  Assess/Plan:

## 2013-08-28 NOTE — Assessment & Plan Note (Signed)
Her chronic diastolic heart failure is amazingly well compensated, despite her rapid ventricular rates. I have asked the patient to increase her beta blocker and hopefully her ventricular rate will slow and diastolic filling time will increase.

## 2013-08-28 NOTE — Assessment & Plan Note (Signed)
Her ventricular rate is not well controllled. I have asked the patient to increase her dose of beta blocker.

## 2013-08-28 NOTE — Assessment & Plan Note (Signed)
Her blood pressure is elevated. We have increased her beta blocker.

## 2013-09-11 ENCOUNTER — Ambulatory Visit (INDEPENDENT_AMBULATORY_CARE_PROVIDER_SITE_OTHER): Payer: Medicare Other | Admitting: Nurse Practitioner

## 2013-09-11 VITALS — BP 116/64 | HR 119 | Resp 20 | Wt 200.0 lb

## 2013-09-11 DIAGNOSIS — I4891 Unspecified atrial fibrillation: Secondary | ICD-10-CM

## 2013-09-11 DIAGNOSIS — I10 Essential (primary) hypertension: Secondary | ICD-10-CM

## 2013-09-11 DIAGNOSIS — I482 Chronic atrial fibrillation, unspecified: Secondary | ICD-10-CM

## 2013-09-11 MED ORDER — METOPROLOL TARTRATE 50 MG PO TABS: 75.0000 mg | ORAL_TABLET | Freq: Two times a day (BID) | ORAL | Status: DC

## 2013-09-11 NOTE — Progress Notes (Signed)
1.) Reason for visit: follow-up ekg due to medication change on 6/24; Dr. Ladona Ridgelaylor increased patient's Metoprolol to 50 mg BID due to a fib with RVR  2.) Name of MD requesting visit: Dr. Ladona Ridgelaylor  3.) H&P: hx of atrial fibrillation with RVR  4.) ROS related to problem: patient denies complaints; daughter states patient lives in long-term care facility and that she is not aware of any complaints; patient in NAD  5.) Assessment and plan per MD:   Per Dr. Myrtis SerKatz, increase Metoprolol to 75 mg BID and return in 2-3 weeks for f/u ekg

## 2013-09-11 NOTE — Patient Instructions (Signed)
Your physician has recommended you make the following change in your medication:  INCREASE Metoprolol to 75 mg twice daily  Your physician recommends that you return for follow-up nurse visit/ekg on Thursday July 23 @ 11:00

## 2013-09-25 ENCOUNTER — Ambulatory Visit (INDEPENDENT_AMBULATORY_CARE_PROVIDER_SITE_OTHER): Payer: Medicare Other | Admitting: *Deleted

## 2013-09-25 ENCOUNTER — Encounter: Payer: Self-pay | Admitting: *Deleted

## 2013-09-25 VITALS — BP 120/82 | HR 117 | Ht 66.0 in | Wt 203.0 lb

## 2013-09-25 DIAGNOSIS — I4891 Unspecified atrial fibrillation: Secondary | ICD-10-CM

## 2013-09-25 DIAGNOSIS — I4819 Other persistent atrial fibrillation: Secondary | ICD-10-CM

## 2013-09-25 MED ORDER — DIGOXIN 125 MCG PO TABS: ORAL_TABLET | ORAL | Status: DC

## 2013-09-25 NOTE — Progress Notes (Signed)
1.) Reason for visit: f/u EKG d/t increase in Metoprolol  2.) Name of MD requesting visit: G Taylor  3.) H&P: At Fib - no complaints  4.) ROS related to problem: no SOB/CP - can not tell HR is elevated  5.) Assessment and plan per MD: reviewed with Dr Ladona Ridgelaylor who started Digoxin 0.125 mg M/W/F - pt to have repeat EKG in 3 weeks.

## 2014-03-12 ENCOUNTER — Encounter: Payer: Self-pay | Admitting: Internal Medicine

## 2014-03-12 ENCOUNTER — Ambulatory Visit (INDEPENDENT_AMBULATORY_CARE_PROVIDER_SITE_OTHER): Payer: Medicare Other | Admitting: Internal Medicine

## 2014-03-12 VITALS — BP 140/78 | HR 99 | Ht 66.0 in | Wt 199.6 lb

## 2014-03-12 DIAGNOSIS — I482 Chronic atrial fibrillation, unspecified: Secondary | ICD-10-CM

## 2014-03-12 DIAGNOSIS — I5032 Chronic diastolic (congestive) heart failure: Secondary | ICD-10-CM

## 2014-03-12 DIAGNOSIS — I10 Essential (primary) hypertension: Secondary | ICD-10-CM

## 2014-03-12 NOTE — Progress Notes (Signed)
HPI Hannah Bowers returns today for followup. She is a very pleasant 81 year old woman with a history of dementia, HTN, symptomatic bradycardia, paroxysmal, and now persistent atrial fibrillation, and recurrent falls. The patient has been on anticoagulation in the past but in the setting of multiple and recurrent falls is no longer a candidate. She has no specific complaints denying chest pain or shortness of breath. Her ventricular rate is now well controlled. When I saw her last, her VR and blood pressure were not controlled and we uptitrated her beta blocker. Since then she is mostly improved.  No Known Allergies   Current Outpatient Prescriptions  Medication Sig Dispense Refill  . digoxin (LANOXIN) 0.125 MG tablet M/W/F (Patient taking differently: Take 0.125 mg by mouth. M/W/F) 90 tablet 3  . galantamine (RAZADYNE ER) 24 MG 24 hr capsule Take 24 mg by mouth daily with breakfast.      . HYDROcodone-acetaminophen (NORCO/VICODIN) 5-325 MG per tablet Take 1 tablet by mouth every 6 (six) hours as needed. 30 tablet 0  . lisinopril (PRINIVIL,ZESTRIL) 2.5 MG tablet Take 2.5 mg by mouth daily.    Marland Kitchen loratadine (CLARITIN) 10 MG tablet Take 10 mg by mouth daily.      Marland Kitchen LORazepam (ATIVAN) 0.5 MG tablet Take 1 tablet (0.5 mg total) by mouth at bedtime. 30 tablet 0  . MELATONIN PO Take 2 mg by mouth at bedtime. Take 2 tablets at bedtime    . metoprolol (LOPRESSOR) 50 MG tablet Take 1.5 tablets (75 mg total) by mouth 2 (two) times daily. 180 tablet 3  . metroNIDAZOLE (METROGEL) 0.75 % gel Apply 1 application topically daily.    . nitroGLYCERIN (NITROSTAT) 0.4 MG SL tablet Place 0.4 mg under the tongue every 5 (five) minutes as needed for chest pain. For chest pain    . pantoprazole (PROTONIX) 40 MG tablet Take 40 mg by mouth daily.      . potassium chloride (KLOR-CON) 10 MEQ CR tablet Take 10 mEq by mouth daily.      Marland Kitchen triamterene-hydrochlorothiazide (MAXZIDE-25) 37.5-25 MG per tablet Take 1 tablet by mouth  daily.     No current facility-administered medications for this visit.     Past Medical History  Diagnosis Date  . Hypertension   . Atrial flutter     paroxysmal  . GERD (gastroesophageal reflux disease)   . Dementia   . COPD (chronic obstructive pulmonary disease)   . Shortness of breath   . Transient ischemic attack (TIA)   . Arthritis     ROS:   All systems reviewed and negative except as noted in the HPI.   Past Surgical History  Procedure Laterality Date  . Radiofrequency ablation  2001  . Orif ankle fracture Left 02/01/2012    Procedure: OPEN REDUCTION INTERNAL FIXATION (ORIF) ANKLE FRACTURE;  Surgeon: Emilee Hero, MD;  Location: [facility];  Service: Orthopedics;  Laterality: Left;     Family History  Problem Relation Age of Onset  . Alzheimer's disease Father     dementia  . Other Mother     possible MS     History   Social History  . Marital Status: Married    Spouse Name: N/A    Number of Children: N/A  . Years of Education: N/A   Occupational History  . Not on file.   Social History Main Topics  . Smoking status: Former Smoker -- 0.25 packs/day for 4 years    Quit date: 01/31/1978  . Smokeless tobacco: Never  Used  . Alcohol Use: No  . Drug Use: No  . Sexual Activity: Not on file   Other Topics Concern  . Not on file   Social History Narrative     BP 140/78 mmHg  Pulse 99  Ht 5\' 6"  (1.676 m)  Wt 199 lb 9.6 oz (90.538 kg)  BMI 32.23 kg/m2  Physical Exam:  elderly appearing 81 year old woman, NAD HEENT: Unremarkable Neck:  No JVD, no thyromegally Lungs:  Clear with no wheezes, rales, or rhonchi. HEART:  IRegular rate rhythm, no murmurs, no rubs, no clicks Abd:  soft, positive bowel sounds, no organomegally, no rebound, no guarding Ext:  2 plus pulses, no edema, no cyanosis, no clubbing Skin:  No rashes no nodules Neuro:  CN II through XII intact, motor grossly intact, she is pleasantly confused  ECG - atrial fib with  a controlled ventricular rate  Assess/Plan:

## 2014-03-12 NOTE — Assessment & Plan Note (Signed)
Her blood pressure is stable. Will continue her current meds. 

## 2014-03-12 NOTE — Assessment & Plan Note (Signed)
Her symptoms are class 2. Will continue her current meds. 

## 2014-03-12 NOTE — Assessment & Plan Note (Signed)
Her ventricular rate is improved. Will continue her current meds.

## 2014-03-12 NOTE — Patient Instructions (Signed)
Your physician wants you to follow-up in: 12 months with Dr. Taylor. You will receive a reminder letter in the mail two months in advance. If you don't receive a letter, please call our office to schedule the follow-up appointment.    

## 2014-06-25 IMAGING — CR DG CHEST 2V
2 series · 2 of 2 positions shown · non-contrast
Comparison: 02/01/2012 and prior chest radiographs

CLINICAL DATA: Chest pain

CHEST - 2 VIEW

[w chest pa]
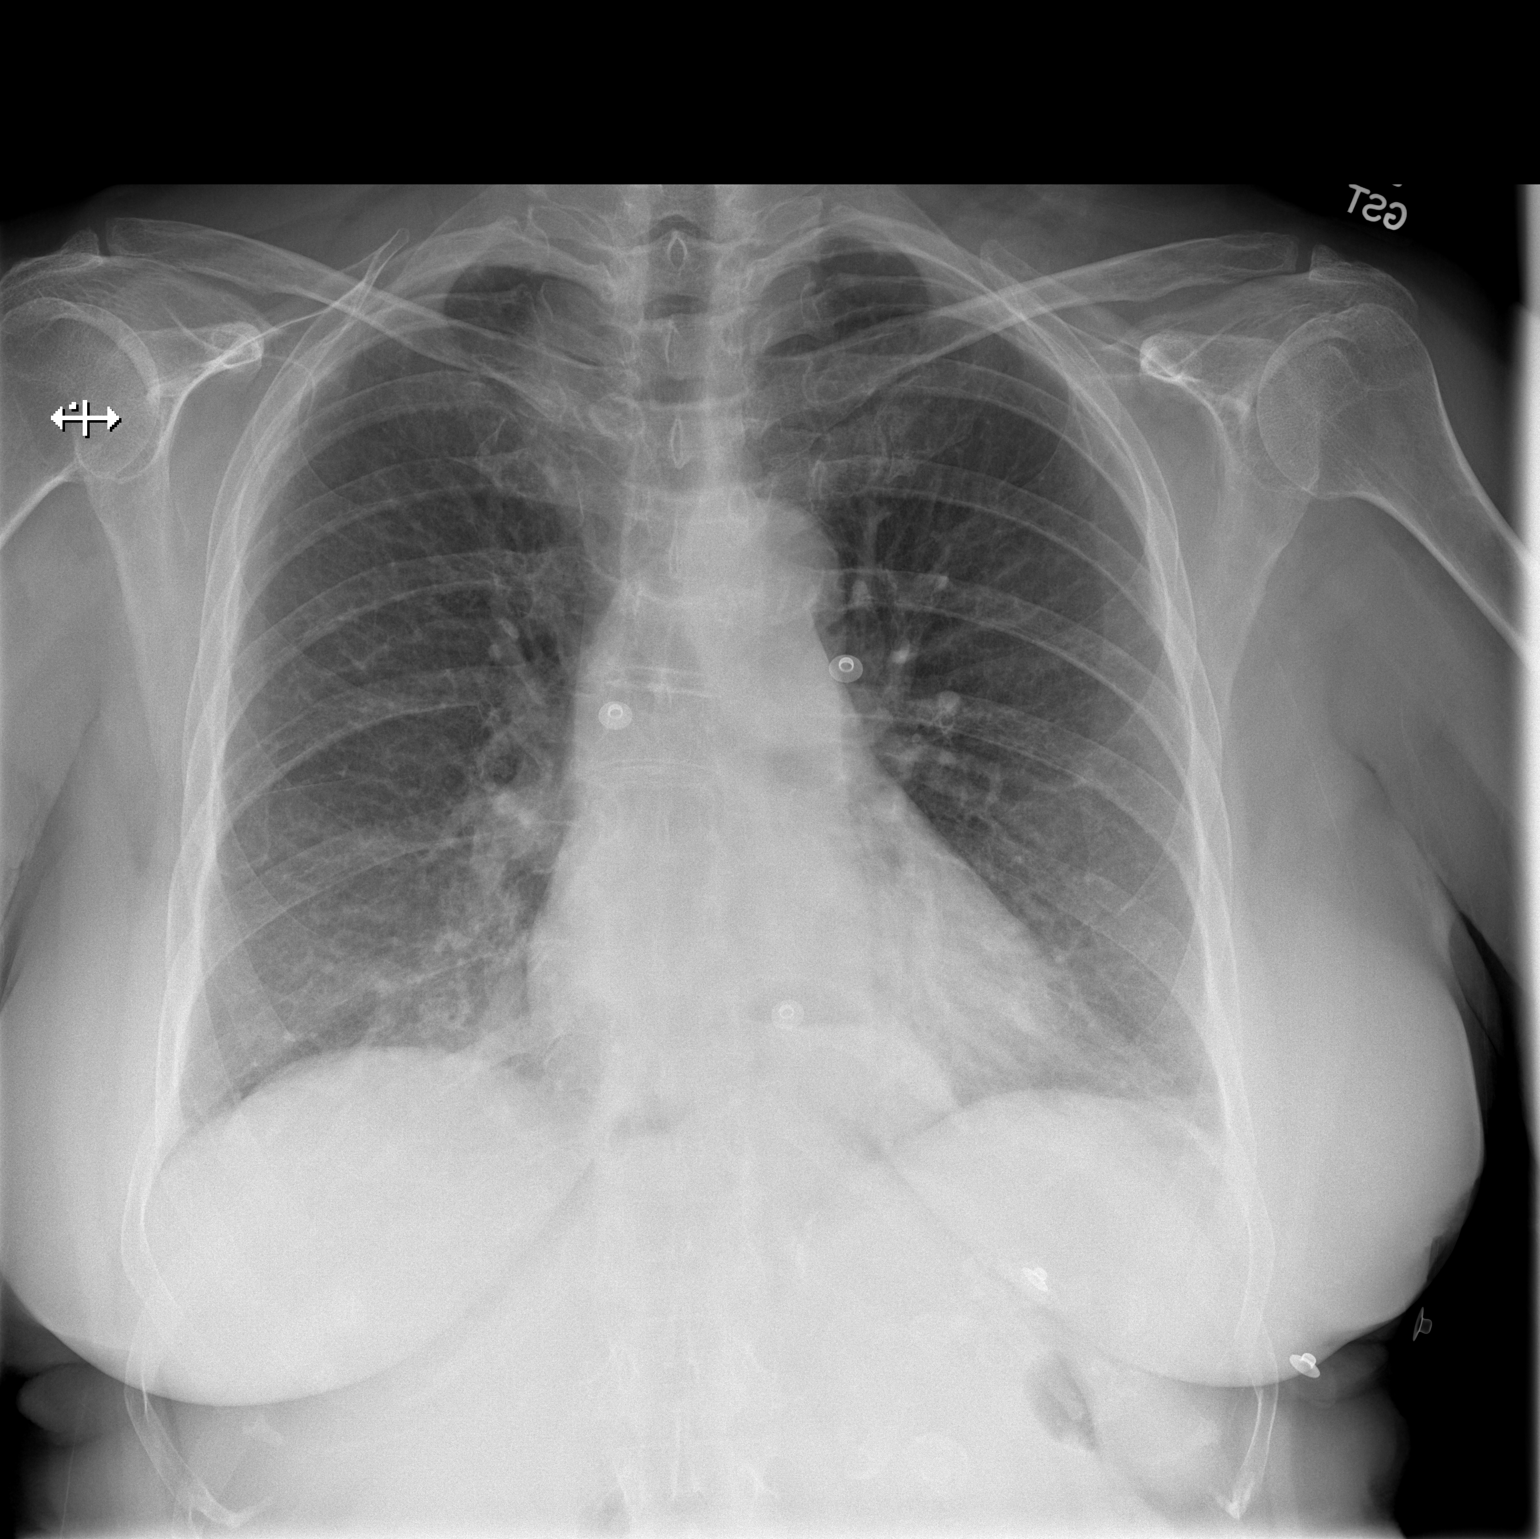

[w chest lat]
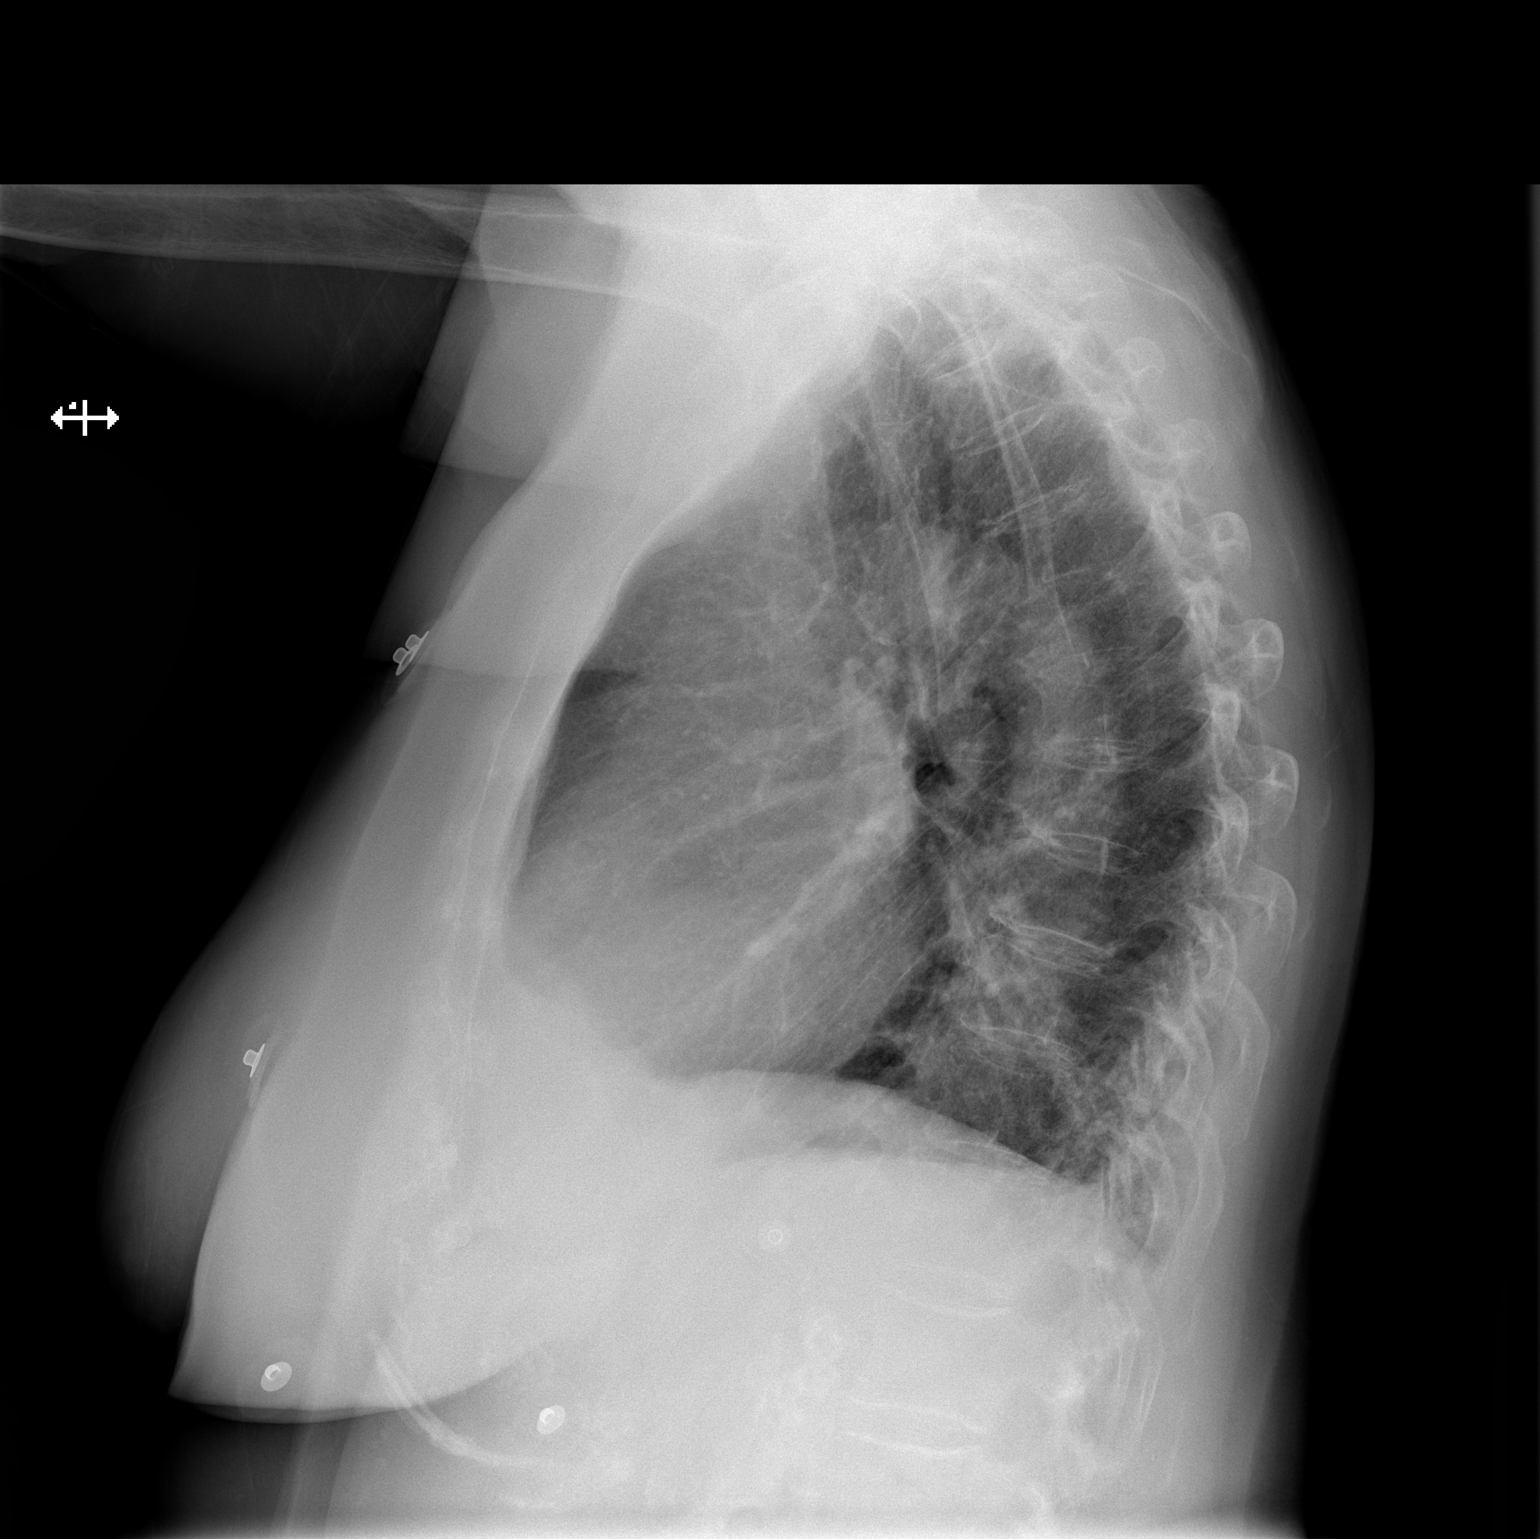

[2 of 2 positions shown; findings below may reference images not displayed]

FINDINGS: The cardiomediastinal silhouette is unremarkable.
Mild bibasilar atelectasis/airspace disease noted.
There is no evidence of pulmonary edema, suspicious pulmonary
nodule/mass, pleural effusion, or pneumothorax.
No acute bony abnormalities are identified.
A hiatal hernia is identified.
IMPRESSION: Mild bibasilar opacities - favor atelectasis over airspace
disease/pneumonia.

Hiatal hernia.

## 2014-10-09 IMAGING — CT CT CERVICAL SPINE W/O CM
2 of 6 series · 6 of 20 positions shown, 7 images · non-contrast
Comparison: 02/01/2012 head CT

CT HEAD

CLINICAL DATA: Fall with head and neck injury.  Pain.

CT HEAD WITHOUT CONTRAST
CT CERVICAL SPINE WITHOUT CONTRAST
TECHNIQUE: Multidetector CT imaging of the head and cervical spine
was performed following the standard protocol without intravenous
contrast.  Multiplanar CT image reconstructions of the cervical
spine were also generated.

[Series 601: coronal · coronal · 0.49mm/px · 3 of 53 slices shown]
[im 13/53  bone]
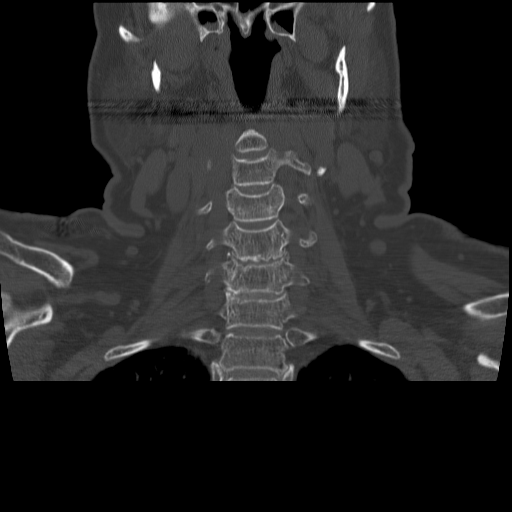
[im 22/53  bone]
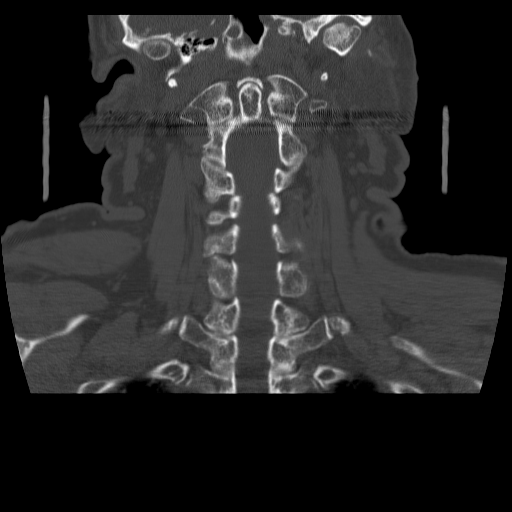
[im 31/53  bone]
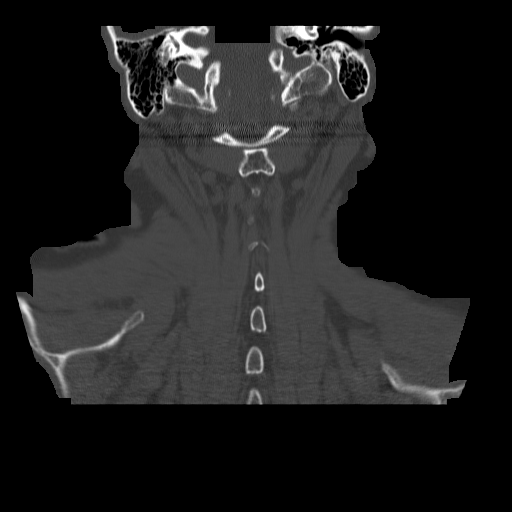

[Series 602: orthogonal · axial · 0.29mm/px · z∈[-278,-193]mm · 3 of 106 slices shown, 4 images]
[im 27/106  soft-tissue]
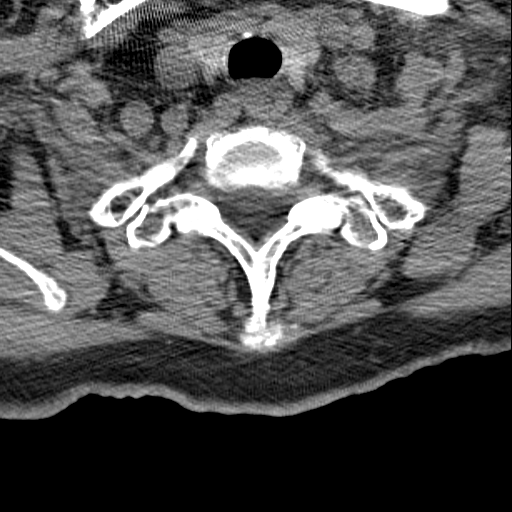
[im 27/106  bone]
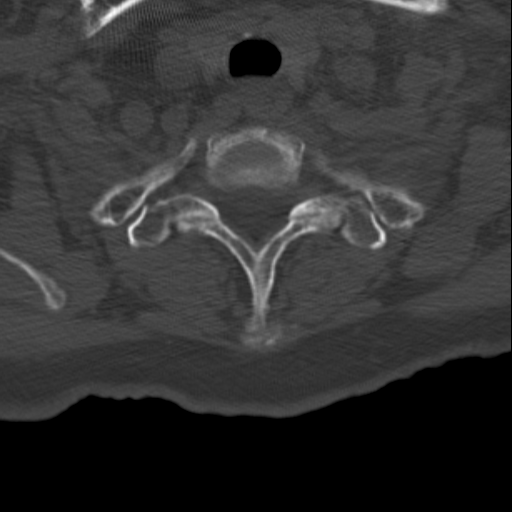
[im 53/106  bone]
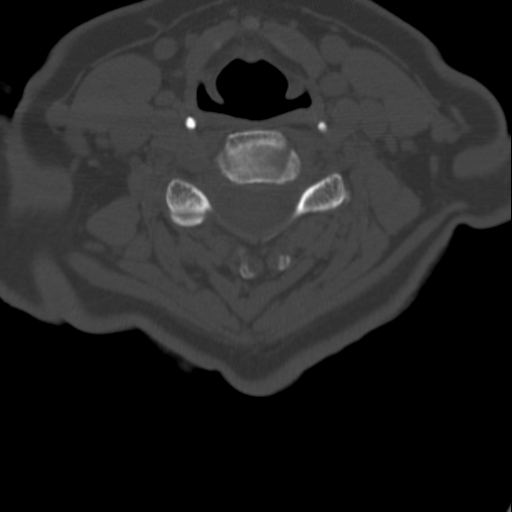
[im 79/106  bone]
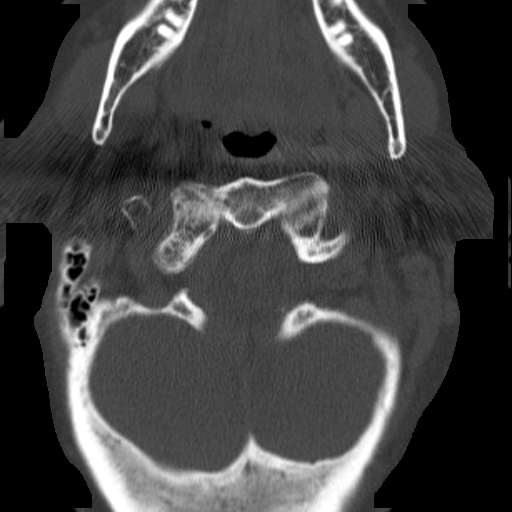

[6 of 20 positions shown; findings below may reference images not displayed]

FINDINGS: Mild generalized cerebral volume loss noted.

No acute intracranial abnormalities are identified, including mass
lesion or mass effect, hydrocephalus, extra-axial fluid collection,
midline shift, hemorrhage, or acute infarction.

The visualized bony calvarium is unremarkable.  A small amount of
fluid within the paranasal sinuses noted.
IMPRESSION: No evidence of acute intracranial abnormality.

CT CERVICAL SPINE
FINDINGS: Normal alignment is noted.
There is no evidence of acute fracture, subluxation or prevertebral
soft tissue swelling.
Mild degenerative disc disease and spondylosis at C5-C6 and C6-C7
noted.
No focal bony lesions are present.
Soft tissue structures are unremarkable.
IMPRESSION: No static evidence of acute injury to the cervical spine.

Mild degenerative changes within the lower cervical spine.

## 2014-11-19 ENCOUNTER — Telehealth: Payer: Self-pay | Admitting: Internal Medicine

## 2014-11-19 NOTE — Telephone Encounter (Signed)
New Message      Pt's daughter calling stating that the pt has a blood clot in her left leg and she is being treated by another doctor for this and the daughter wants to let Dr. Ladona Ridgel know what is going on. Please call back and advise.

## 2014-11-19 NOTE — Telephone Encounter (Signed)
DVT in left lower leg, she was started on Lovenox and Coumadin last week and the Lovenox was d/c'd yesterday and Coumadin continued.  She had previously been on a NOAC for her afib and this was d/c'd at some point due to patient falling. She is now on Coumadin only and the MD at [facility] (Dr Trip) is following.  Her daughter just wanted Dr Ladona Ridgel to be aware.

## 2015-03-23 ENCOUNTER — Encounter: Payer: Self-pay | Admitting: Internal Medicine

## 2015-03-23 ENCOUNTER — Ambulatory Visit (INDEPENDENT_AMBULATORY_CARE_PROVIDER_SITE_OTHER): Payer: Medicare Other | Admitting: Internal Medicine

## 2015-03-23 ENCOUNTER — Other Ambulatory Visit: Payer: Self-pay

## 2015-03-23 VITALS — BP 128/78 | HR 108 | Ht 66.0 in | Wt 193.8 lb

## 2015-03-23 DIAGNOSIS — I482 Chronic atrial fibrillation, unspecified: Secondary | ICD-10-CM

## 2015-03-23 DIAGNOSIS — I5032 Chronic diastolic (congestive) heart failure: Secondary | ICD-10-CM

## 2015-03-23 DIAGNOSIS — I10 Essential (primary) hypertension: Secondary | ICD-10-CM

## 2015-03-23 NOTE — Progress Notes (Signed)
HPI Hannah Bowers returns today for followup. She is a very pleasant 82 year old woman with a history of dementia, HTN, symptomatic bradycardia, paroxysmal, and now chronic atrial fibrillation. The patient had been off of coumadin but had an embolic event to her leg and is now back on coumadin as the risk/benefit ration was thought to be in favor of another try with coumadin. She has no specific complaints denying chest pain or shortness of breath. Her ventricular rate is now fairly well controlled on high dose beta blocker therapy. No Known Allergies   Current Outpatient Prescriptions  Medication Sig Dispense Refill  . cholecalciferol (VITAMIN D) 400 units TABS tablet Take 400 Units by mouth daily.    . digoxin (LANOXIN) 0.125 MG tablet Take 0.125 mg by mouth 3 (three) times a week.    . levothyroxine (SYNTHROID, LEVOTHROID) 125 MCG tablet Take 1 tablet by mouth daily.    Marland Kitchen lisinopril (PRINIVIL,ZESTRIL) 2.5 MG tablet Take 2.5 mg by mouth daily.    Marland Kitchen loratadine (CLARITIN) 10 MG tablet Take 10 mg by mouth daily as needed for allergies.     Marland Kitchen MELATONIN PO Take 2 mg by mouth at bedtime.     . metoprolol (LOPRESSOR) 100 MG tablet Take 1 tablet by mouth 2 (two) times daily.    . nitroGLYCERIN (NITROSTAT) 0.4 MG SL tablet Place 0.4 mg under the tongue every 5 (five) minutes as needed for chest pain. For chest pain    . pantoprazole (PROTONIX) 40 MG tablet Take 40 mg by mouth daily as needed (Heartburn or acid reflux).     . potassium chloride (KLOR-CON) 10 MEQ CR tablet Take 10 mEq by mouth daily.      Marland Kitchen triamterene-hydrochlorothiazide (MAXZIDE-25) 37.5-25 MG per tablet Take 1 tablet by mouth daily.    Marland Kitchen warfarin (COUMADIN) 2 MG tablet Take as directed by the coumadin clinic    . warfarin (COUMADIN) 2.5 MG tablet Take as directed by the coumadin clinic     No current facility-administered medications for this visit.     Past Medical History  Diagnosis Date  . Hypertension   . Atrial flutter (HCC)      paroxysmal  . GERD (gastroesophageal reflux disease)   . Dementia   . COPD (chronic obstructive pulmonary disease) (HCC)   . Shortness of breath   . Transient ischemic attack (TIA)   . Arthritis     ROS:   All systems reviewed and negative except as noted in the HPI.   Past Surgical History  Procedure Laterality Date  . Radiofrequency ablation  2001  . Orif ankle fracture Left 02/01/2012    Procedure: OPEN REDUCTION INTERNAL FIXATION (ORIF) ANKLE FRACTURE;  Surgeon: Emilee Hero, MD;  Location: [facility];  Service: Orthopedics;  Laterality: Left;     Family History  Problem Relation Age of Onset  . Alzheimer's disease Father     dementia  . Other Mother     possible MS     Social History   Social History  . Marital Status: Married    Spouse Name: N/A  . Number of Children: N/A  . Years of Education: N/A   Occupational History  . Not on file.   Social History Main Topics  . Smoking status: Former Smoker -- 0.25 packs/day for 4 years    Quit date: 01/31/1978  . Smokeless tobacco: Never Used  . Alcohol Use: No  . Drug Use: No  . Sexual Activity: Not on file   Other  Topics Concern  . Not on file   Social History Narrative     BP 128/78 mmHg  Pulse 108  Ht  (1.676 m)  Wt 193 lb 12.8 oz (87.907 kg)  BMI 31.30 kg/m2  Physical Exam:  elderly appearing 82 year old woman, NAD HEENT: Unremarkable Neck:  6 cm JVD, no thyromegally Lungs:  Clear with no wheezes, rales, or rhonchi. HEART:  IRegular rate rhythm, no murmurs, no rubs, no clicks Abd:  soft, positive bowel sounds, no organomegally, no rebound, no guarding Ext:  2 plus pulses, no edema, no cyanosis, no clubbing Skin:  No rashes no nodules Neuro:  CN II through XII intact, motor grossly intact, she is pleasantly confused  ECG - atrial fib with an increased ventricular rate  Assess/Plan:

## 2015-03-23 NOTE — Assessment & Plan Note (Signed)
Her ventricular rate has been under control although today less so today. She is not symptomatic. She will continue her systemic anti-coagulation. She has not been falling.

## 2015-03-23 NOTE — Assessment & Plan Note (Signed)
Her blood pressure is well controlled. Will follow. No medication changes today.

## 2015-03-23 NOTE — Assessment & Plan Note (Signed)
Her symptoms remain class 2. She will continue her current meds. She has some mild peripheral edema and is asymptomatic.

## 2015-03-23 NOTE — Patient Instructions (Signed)

## 2016-04-21 ENCOUNTER — Encounter: Payer: Self-pay | Admitting: Internal Medicine

## 2016-04-21 ENCOUNTER — Ambulatory Visit (INDEPENDENT_AMBULATORY_CARE_PROVIDER_SITE_OTHER): Payer: Medicare Other | Admitting: Internal Medicine

## 2016-04-21 VITALS — BP 124/72 | HR 107 | Ht 66.0 in | Wt 197.0 lb

## 2016-04-21 DIAGNOSIS — I482 Chronic atrial fibrillation, unspecified: Secondary | ICD-10-CM

## 2016-04-21 NOTE — Patient Instructions (Signed)

## 2016-04-21 NOTE — Progress Notes (Signed)
HPI Hannah Bowers returns today for followup. She is a very pleasant 83 year old woman with a history of dementia, HTN, symptomatic bradycardia, paroxysmal, and now chronic atrial fibrillation. The patient had been off of coumadin but had an embolic event to her leg and is now back on coumadin as the risk/benefit ration was thought to be in favor of another try with coumadin. She has no specific complaints denying chest pain or shortness of breath.  No Known Allergies   Current Outpatient Prescriptions  Medication Sig Dispense Refill  . cholecalciferol (VITAMIN D) 400 units TABS tablet Take 400 Units by mouth daily.    . digoxin (LANOXIN) 0.125 MG tablet Take 0.125 mg by mouth 3 (three) times a week.    . levothyroxine (SYNTHROID, LEVOTHROID) 125 MCG tablet Take 1 tablet by mouth daily.    Marland Kitchen loratadine (CLARITIN) 10 MG tablet Take 10 mg by mouth daily as needed for allergies.     Marland Kitchen MELATONIN PO Take 2 mg by mouth at bedtime.     . metoprolol (LOPRESSOR) 100 MG tablet Take 1 tablet by mouth 2 (two) times daily.    . nitroGLYCERIN (NITROSTAT) 0.4 MG SL tablet Place 0.4 mg under the tongue every 5 (five) minutes as needed for chest pain. For chest pain    . potassium chloride (KLOR-CON) 10 MEQ CR tablet Take 10 mEq by mouth daily.      Marland Kitchen triamterene-hydrochlorothiazide (MAXZIDE-25) 37.5-25 MG per tablet Take 1 tablet by mouth daily.    Marland Kitchen warfarin (COUMADIN) 3 MG tablet Take as directed by the coumadin clinic     No current facility-administered medications for this visit.      Past Medical History:  Diagnosis Date  . Arthritis   . Atrial flutter (HCC)    paroxysmal  . COPD (chronic obstructive pulmonary disease) (HCC)   . Dementia   . GERD (gastroesophageal reflux disease)   . Hypertension   . Shortness of breath   . Transient ischemic attack (TIA)     ROS:   All systems reviewed and negative except as noted in the HPI.   Past Surgical History:  Procedure Laterality Date  . ORIF  ANKLE FRACTURE Left 02/01/2012   Procedure: OPEN REDUCTION INTERNAL FIXATION (ORIF) ANKLE FRACTURE;  Surgeon: Emilee Hero, MD;  Location: [facility];  Service: Orthopedics;  Laterality: Left;  . RADIOFREQUENCY ABLATION  2001     Family History  Problem Relation Age of Onset  . Other Mother     possible MS  . Alzheimer's disease Father     dementia     Social History   Social History  . Marital status: Married    Spouse name: N/A  . Number of children: N/A  . Years of education: N/A   Occupational History  . Not on file.   Social History Main Topics  . Smoking status: Former Smoker    Packs/day: 0.25    Years: 4.00    Quit date: 01/31/1978  . Smokeless tobacco: Never Used  . Alcohol use No  . Drug use: No  . Sexual activity: Not on file   Other Topics Concern  . Not on file   Social History Narrative  . No narrative on file     BP 124/72   Pulse (!) 107   Ht 5\' 6"  (1.676 m)   Wt 197 lb (89.4 kg)   BMI 31.80 kg/m   Physical Exam:  elderly and confused appearing 83 year old woman, NAD HEENT: Unremarkable Neck:  6 cm JVD, no thyromegally Lungs:  Clear with no wheezes, rales, or rhonchi. HEART:  IRegular rate rhythm, no murmurs, no rubs, no clicks Abd:  soft, positive bowel sounds, no organomegally, no rebound, no guarding Ext:  2 plus pulses, no edema, no cyanosis, no clubbing Skin:  No rashes no nodules Neuro:  CN II through XII intact, motor grossly intact, she is pleasantly confused  ECG - atrial fib with an increased ventricular rate  Assess/Plan: 1. Chronic atrial fib - she is reasonably well rate controlled. She does not feel palpitations. 2. HTN heart disease - her blood pressure is now well controlled. Will follow. 3. Dementia - she appears to be calm. No change in meds.  Hannah Bowers,M.D.

## 2017-03-13 ENCOUNTER — Telehealth: Payer: Self-pay | Admitting: Internal Medicine

## 2017-03-13 NOTE — Telephone Encounter (Signed)
Patient daughter calling [name]([name]) states that patient suffers from dementia. Patient is having trouble understanding. Arline AspCindy would like to know if there is a way patient can been seen at her living facility, Arline AspCindy states that it is hard to bring patient out to appts.Please advise.

## 2017-03-13 NOTE — Telephone Encounter (Signed)
F/U Arline AspCindy asks that we call after 3 pm today.

## 2017-03-14 NOTE — Telephone Encounter (Signed)
Detailed message left per DPR. Notified daughter Dr. Ladona Ridgelaylor OK with Dr. Redmond Schoolripp [facility] assuming Pt care. Notified to call office if we can be of any further assistance.

## 2017-04-27 ENCOUNTER — Ambulatory Visit: Payer: Medicare Other | Admitting: Internal Medicine

## 2019-04-07 DEATH — deceased
